# Patient Record
Sex: Male | Born: 1950 | Race: White | Hispanic: No | Marital: Married | Smoking: Former smoker
Health system: Southern US, Community
[De-identification: ages and names within clinical notes are randomized; demographics above are authoritative.]

## PROBLEM LIST (undated history)

## (undated) DIAGNOSIS — K219 Gastro-esophageal reflux disease without esophagitis: Secondary | ICD-10-CM

## (undated) DIAGNOSIS — Z86010 Personal history of colonic polyps: Principal | ICD-10-CM

## (undated) DIAGNOSIS — T7840XA Allergy, unspecified, initial encounter: Secondary | ICD-10-CM

## (undated) DIAGNOSIS — F419 Anxiety disorder, unspecified: Secondary | ICD-10-CM

## (undated) DIAGNOSIS — H269 Unspecified cataract: Secondary | ICD-10-CM

## (undated) DIAGNOSIS — K635 Polyp of colon: Secondary | ICD-10-CM

## (undated) DIAGNOSIS — E119 Type 2 diabetes mellitus without complications: Secondary | ICD-10-CM

## (undated) DIAGNOSIS — I10 Essential (primary) hypertension: Secondary | ICD-10-CM

## (undated) DIAGNOSIS — M1990 Unspecified osteoarthritis, unspecified site: Secondary | ICD-10-CM

## (undated) HISTORY — PX: COLONOSCOPY: SHX174

## (undated) HISTORY — DX: Gastro-esophageal reflux disease without esophagitis: K21.9

## (undated) HISTORY — DX: Allergy, unspecified, initial encounter: T78.40XA

## (undated) HISTORY — PX: SHOULDER ARTHROSCOPY W/ ROTATOR CUFF REPAIR: SHX2400

## (undated) HISTORY — DX: Polyp of colon: K63.5

## (undated) HISTORY — DX: Essential (primary) hypertension: I10

## (undated) HISTORY — PX: OTHER SURGICAL HISTORY: SHX169

## (undated) HISTORY — DX: Anxiety disorder, unspecified: F41.9

## (undated) HISTORY — DX: Personal history of colonic polyps: Z86.010

## (undated) HISTORY — PX: CATARACT EXTRACTION: SUR2

## (undated) HISTORY — DX: Unspecified cataract: H26.9

## (undated) HISTORY — DX: Unspecified osteoarthritis, unspecified site: M19.90

## (undated) HISTORY — DX: Type 2 diabetes mellitus without complications: E11.9

---

## 2001-10-16 ENCOUNTER — Encounter: Payer: Self-pay | Admitting: Family Medicine

## 2001-10-16 ENCOUNTER — Encounter: Admission: RE | Admit: 2001-10-16 | Discharge: 2001-10-16 | Payer: Self-pay | Admitting: Family Medicine

## 2002-02-19 ENCOUNTER — Ambulatory Visit (HOSPITAL_BASED_OUTPATIENT_CLINIC_OR_DEPARTMENT_OTHER): Admission: RE | Admit: 2002-02-19 | Discharge: 2002-02-19 | Payer: Self-pay | Admitting: Orthopedic Surgery

## 2004-04-08 ENCOUNTER — Ambulatory Visit: Payer: Self-pay | Admitting: Gastroenterology

## 2004-04-30 ENCOUNTER — Ambulatory Visit: Payer: Self-pay | Admitting: Gastroenterology

## 2004-06-26 ENCOUNTER — Encounter: Admission: RE | Admit: 2004-06-26 | Discharge: 2004-06-26 | Payer: Self-pay | Admitting: Family Medicine

## 2004-12-03 ENCOUNTER — Ambulatory Visit (HOSPITAL_BASED_OUTPATIENT_CLINIC_OR_DEPARTMENT_OTHER): Admission: RE | Admit: 2004-12-03 | Discharge: 2004-12-03 | Payer: Self-pay | Admitting: Orthopedic Surgery

## 2004-12-03 ENCOUNTER — Ambulatory Visit (HOSPITAL_COMMUNITY): Admission: RE | Admit: 2004-12-03 | Discharge: 2004-12-03 | Payer: Self-pay | Admitting: Orthopedic Surgery

## 2009-05-07 ENCOUNTER — Encounter (INDEPENDENT_AMBULATORY_CARE_PROVIDER_SITE_OTHER): Payer: Self-pay | Admitting: *Deleted

## 2009-05-12 ENCOUNTER — Encounter (INDEPENDENT_AMBULATORY_CARE_PROVIDER_SITE_OTHER): Payer: Self-pay | Admitting: *Deleted

## 2009-06-04 ENCOUNTER — Encounter (INDEPENDENT_AMBULATORY_CARE_PROVIDER_SITE_OTHER): Payer: Self-pay | Admitting: *Deleted

## 2009-06-18 ENCOUNTER — Encounter (INDEPENDENT_AMBULATORY_CARE_PROVIDER_SITE_OTHER): Payer: Self-pay | Admitting: *Deleted

## 2009-06-20 ENCOUNTER — Ambulatory Visit: Payer: Self-pay | Admitting: Gastroenterology

## 2009-07-18 ENCOUNTER — Ambulatory Visit: Payer: Self-pay | Admitting: Gastroenterology

## 2009-07-23 ENCOUNTER — Encounter: Payer: Self-pay | Admitting: Gastroenterology

## 2010-01-19 ENCOUNTER — Observation Stay (HOSPITAL_COMMUNITY): Admission: EM | Admit: 2010-01-19 | Discharge: 2010-01-20 | Payer: Self-pay | Admitting: Emergency Medicine

## 2010-03-13 ENCOUNTER — Emergency Department (HOSPITAL_COMMUNITY)
Admission: EM | Admit: 2010-03-13 | Discharge: 2010-03-13 | Payer: Self-pay | Source: Home / Self Care | Admitting: Family Medicine

## 2010-03-24 NOTE — Letter (Signed)
Summary: Patient Notice-Hyperplastic Polyps  [facility]  [address] [place], [place] [address]   Phone: [phone]  Fax: [phone]        July 23, 2009 MRN: [id number]    Paul Hicks [address] [place], [place]  [address]    Dear Paul Hicks,  I am pleased to inform you that the colon polyp(s) removed during your recent colonoscopy was (were) found to be hyperplastic.  These types of polyps are NOT pre-cancerous.  In view of your family history of colon cancer,  my recommendation is that you have a repeat colonoscopy examination in 5_ years for higher risk colorectal cancer screening.  Should you develop new or worsening symptoms of abdominal pain, bowel habit changes or bleeding from the rectum or bowels, please schedule an evaluation with either your primary care physician or with me.  Additional information/recommendations:  __No further action with gastroenterology is needed at this time.      Please follow-up with your primary care physician for your other      healthcare needs. __Please call [phone] to schedule a return visit to review      your situation.  __Please keep your follow-up visit as already scheduled.  _x_Continue treatment plan as outlined the day of your exam.  Please call us if you are having persistent problems or have questions about your condition that have not been fully answered at this time.  Sincerely,  Paul Meckel MD This letter has been electronically signed by your physician.  Appended Document: Patient Notice-Hyperplastic Polyps letter mailed.

## 2010-03-24 NOTE — Miscellaneous (Signed)
Summary: [facility] PV  Clinical Lists Changes  Medications: Added new medication of MOVIPREP 100 GM  SOLR (PEG-KCL-NACL-NASULF-NA ASC-C) As per prep instructions. - Signed Rx of MOVIPREP 100 GM  SOLR (PEG-KCL-NACL-NASULF-NA ASC-C) As per prep instructions.;  #1 x 0;  Signed;  Entered by: Ezra Sites RN;  Authorized by: Louis Meckel MD;  Method used: [facility]  [place] [address] [address], [place], [place]  [address], Ph: [phone] or [phone], Fax: [phone] Observations: Added new observation of NKA: T (06/20/2009 13:44)    Prescriptions: MOVIPREP 100 GM  SOLR (PEG-KCL-NACL-NASULF-NA ASC-C) As per prep instructions.  #1 x 0   Entered by:   Ezra Sites RN   Authorized by:   Louis Meckel MD   Signed by:   Ezra Sites RN on 06/20/2009   Method used:   [facility]  [place] [address] (retail[facility]       [address]       [place], [place]  [address]       Ph: [phone] or [phone]       Fax: [phone]   RxID:   [id number]

## 2010-03-24 NOTE — Letter (Signed)
Summary: [facility] Instructions  [facility]  [address] [place], [place] [address]   Phone: [phone]  Fax: [phone]       Paul Hicks    10-18-1950    MRN: [id number]        Procedure Day /Date:  Friday 07/18/2009     Arrival Time: 8:30 am      Procedure Time: 9:30 am     Location of Procedure:                    _ x_  [facility] (4th Floor)                        PREPARATION FOR COLONOSCOPY WITH MOVIPREP   Starting 5 days prior to your procedure Sunday 5/22 do not eat nuts, seeds, popcorn, corn, beans, peas,  salads, or any raw vegetables.  Do not take any fiber supplements (e.g. Metamucil, Citrucel, and Benefiber).  THE DAY BEFORE YOUR PROCEDURE         DATE: Thursday 5/26  1.  Drink clear liquids the entire day-NO SOLID FOOD  2.  Do not drink anything colored red or purple.  Avoid juices with pulp.  No orange juice.  3.  Drink at least 64 oz. (8 glasses) of fluid/clear liquids during the day to prevent dehydration and help the prep work efficiently.  CLEAR LIQUIDS INCLUDE: Water Jello Ice Popsicles Tea (sugar ok, no milk/cream) Powdered fruit flavored drinks Coffee (sugar ok, no milk/cream) Gatorade Juice: apple, white grape, white cranberry  Lemonade Clear bullion, consomm, broth Carbonated beverages (any kind) Strained chicken noodle soup Hard Candy                             4.  In the morning, mix first dose of MoviPrep solution:    Empty 1 Pouch A and 1 Pouch B into the disposable container    Add lukewarm drinking water to the top line of the container. Mix to dissolve    Refrigerate (mixed solution should be used within 24 hrs)  5.  Begin drinking the prep at 5:00 p.m. The MoviPrep container is divided by 4 marks.   Every 15 minutes drink the solution down to the next mark (approximately 8 oz) until the full liter is complete.   6.  Follow completed prep with 16 oz of clear liquid of your choice (Nothing red  or purple).  Continue to drink clear liquids until bedtime.  7.  Before going to bed, mix second dose of MoviPrep solution:    Empty 1 Pouch A and 1 Pouch B into the disposable container    Add lukewarm drinking water to the top line of the container. Mix to dissolve    Refrigerate  THE DAY OF YOUR PROCEDURE      DATE: Friday 5/27  Beginning at 4:30 a.m. (5 hours before procedure):         1. Every 15 minutes, drink the solution down to the next mark (approx 8 oz) until the full liter is complete.  2. Follow completed prep with 16 oz. of clear liquid of your choice.    3. You may drink clear liquids until 7:30 am (2 HOURS BEFORE PROCEDURE).   MEDICATION INSTRUCTIONS  Unless otherwise instructed, you should take regular prescription medications with a small sip of water   as early as possible the morning of  your procedure.   Additional medication instructions: Do not take Diovan/HCTZ morning of procedure.         OTHER INSTRUCTIONS  You will need a responsible adult at least 62 years of age to accompany you and drive you home.   This person must remain in the waiting room during your procedure.  Wear loose fitting clothing that is easily removed.  Leave jewelry and other valuables at home.  However, you may wish to bring a book to read or  an iPod/MP3 player to listen to music as you wait for your procedure to start.  Remove all body piercing jewelry and leave at home.  Total time from sign-in until discharge is approximately 2-3 hours.  You should go home directly after your procedure and rest.  You can resume normal activities the  day after your procedure.  The day of your procedure you should not:   Drive   Make legal decisions   Operate machinery   Drink alcohol   Return to work  You will receive specific instructions about eating, activities and medications before you leave.    The above instructions have been reviewed and explained to me by    Ezra Sites RN  June 20, 2009 2:09 PM    I fully understand and can verbalize these instructions _____________________________ Date _________

## 2010-03-24 NOTE — Procedures (Signed)
Summary: Colonoscopy  Patient: [name] Note: All result statuses are Final unless otherwise noted.  Tests: (1) Colonoscopy (COL)   COL Colonoscopy           DONE     [facility]     [address]     [place], [place]  [address]           COLONOSCOPY PROCEDURE REPORT           PATIENT:  Paul Hicks, Paul Hicks  MR#:  [id number]     BIRTHDATE:  1951/02/18, 58 yrs. old  GENDER:  male           ENDOSCOPIST:  Barbette Hair. Arlyce Dice, MD     Referred by:           PROCEDURE DATE:  07/18/2009     PROCEDURE:  Colonoscopy with snare polypectomy     ASA CLASS:  Class II     INDICATIONS:  1) screening  2) family history of colon cancer  3)     history of pre-cancerous (adenomatous) colon polyps brother           MEDICATIONS:   Fentanyl 75 mcg IV, Versed 6 mg IV           DESCRIPTION OF PROCEDURE:   After the risks benefits and     alternatives of the procedure were thoroughly explained, informed     consent was obtained.  No rectal exam performed. The LB CF-H180AL     [id number] endoscope was introduced through the anus and advanced to     the cecum, which was identified by both the appendix and ileocecal     valve, without limitations.  The quality of the prep was     excellent, using MoviPrep.  The instrument was then slowly     withdrawn as the colon was fully examined.     <<PROCEDUREIMAGES>>           FINDINGS:  There were multiple polyps identified and removed. in     the sigmoid colon (see image15). 3 diminutive hyperplastic     appearing polyps at 13-15cm - removed with cold biopsy snare and     submitted to pathology  Moderate diverticulosis was found in the     sigmoid colon (see image12 and image13).  Mild diverticulosis was     found in the descending colon.  This was otherwise a normal     examination of the colon (see image1, image3, image4, image6,     image9, image14, and image18).   Retroflexed views in the rectum     revealed no abnormalities.    The time to cecum =  2.0   minutes.     The scope was then withdrawn (time =  8.75  min) from the patient     and the procedure completed.           COMPLICATIONS:  None           ENDOSCOPIC IMPRESSION:     1) Polyps, multiple in the sigmoid colon     2) Moderate diverticulosis in the sigmoid colon     3) Mild diverticulosis in the descending colon     4) Otherwise normal examination     RECOMMENDATIONS:     1) Given your significant family history of colon cancer, you     should have a repeat colonoscopy in 5 years           REPEAT EXAM:  In 5 year(s) for Colonoscopy.           ______________________________     Barbette Hair. Arlyce Dice, MD           CC: Herb Grays, MD           n.     Rosalie DoctorBarbette Hair. Atianna Haidar at 07/18/2009 10:39 AM           Page 2 of 3   [place], [place], [id number]  Note: An exclamation mark (!) indicates a result that was not dispersed into the flowsheet. Document Creation Date: 07/18/2009 10:40 AM _______________________________________________________________________  (1) Order result status: Final Collection or observation date-time: 07/18/2009 10:33 Requested date-time:  Receipt date-time:  Reported date-time:  Referring Physician:   Ordering Physician: Melvia Heaps [phone]) Specimen Source:  Source: Launa Grill Order Number: [id number] Lab site:   Appended Document: Colonoscopy     Procedures Next Due Date:    Colonoscopy: 07/2014

## 2010-03-24 NOTE — Letter (Signed)
Summary: Previsit letter  [facility]  [address] [place], [place] [address]   Phone: [phone]  Fax: [phone]       05/12/2009 MRN: [id number]  Paul Hicks [address] [place], [place]  [address]  Dear [name],  Welcome to the Gastroenterology Division at [facility]    You are scheduled to see a nurse for your pre-procedure visit on 06-20-09 at 2:00p.m. on the 3rd floor at [facility], [address]  We ask that you try to arrive at our office 15 minutes prior to your appointment time to allow for check-in.  Your nurse visit will consist of discussing your medical and surgical history, your immediate family medical history, and your medications.    Please bring a complete list of all your medications or, if you prefer, bring the medication bottles and we will list them.  We will need to be aware of both prescribed and over the counter drugs.  We will need to know exact dosage information as well.  If you are on blood thinners (Coumadin, Plavix, Aggrenox, Ticlid, etc.) please call our office today/prior to your appointment, as we need to consult with your physician about holding your medication.   Please be prepared to read and sign documents such as consent forms, a financial agreement, and acknowledgement forms.  If necessary, and with your consent, a friend or relative is welcome to sit-in on the nurse visit with you.  Please bring your insurance card so that we may make a copy of it.  If your insurance requires a referral to see a specialist, please bring your referral form from your primary care physician.  No co-pay is required for this nurse visit.     If you cannot keep your appointment, please call [phone] to cancel or reschedule prior to your appointment date.  This allows [place] the opportunity to schedule an appointment for another patient in need of care.    Thank you for choosing [facility] for your medical  needs.  We appreciate the opportunity to care for you.  Please visit [place] at our website  to learn more about our practice.                     Sincerely.                                                                                                                   The Gastroenterology Division

## 2010-03-24 NOTE — Letter (Signed)
Summary: Appointment Reminder  [facility]  [address] [place], [place] [address]   Phone: [phone]  Fax: [phone]        June 04, 2009 MRN: [id number]    NELS MUNN [address] [place], [place]  [address]    Dear Mr. KABLER,   We have been unable to reach you by phone to reschedule an   appointment that was scheduled for you by Dr. Arlyce Dice for a Colonoscopy. It is very important that we reach you to reschedule an appointment. We apologize for the inconvenience and hope that you allow [place] to participate in your health care needs. Please contact us at [phone] at your earliest convenience to reschedule your appointment.     Sincerely,  [employer]

## 2010-03-24 NOTE — Letter (Signed)
Summary: Colonoscopy Letter  [facility]  [address] [place], [place] [address]   Phone: [phone]  Fax: [phone]      May 07, 2009 MRN: [id number]   Paul Hicks [address] [place], [place]  [address]   Dear [name],   According to your medical record, it is time for you to schedule a Colonoscopy. The American Cancer Society recommends this procedure as a method to detect early colon cancer. Patients with a family history of colon cancer, or a personal history of colon polyps or inflammatory bowel disease are at increased risk.  This letter has beeen generated based on the recommendations made at the time of your procedure. If you feel that in your particular situation this may no longer apply, please contact our office.  Please call our office at [phone] to schedule this appointment or to update your records at your earliest convenience.  Thank you for cooperating with [place] to provide you with the very best care possible.   Sincerely,  Barbette Hair. Arlyce Dice, M.D.  [facility] Gastroenterology Division [phone]

## 2010-05-05 LAB — BASIC METABOLIC PANEL
CO2: 20 mEq/L (ref 19–32)
Chloride: 109 mEq/L (ref 96–112)
Creatinine, Ser: 1.2 mg/dL (ref 0.4–1.5)
GFR calc Af Amer: >60 mL/min (ref >60)
Sodium: 135 mEq/L (ref 135–145)

## 2010-05-05 LAB — URINALYSIS, ROUTINE W REFLEX MICROSCOPIC
Ketones, ur: NEGATIVE mg/dL
Leukocytes, UA: NEGATIVE
Nitrite: NEGATIVE
Protein, ur: 100 mg/dL — AB
Urobilinogen, UA: 0.2 mg/dL (ref 0.0–1.0)
pH: 5.5 (ref 5.0–8.0)

## 2010-05-05 LAB — URINE MICROSCOPIC-ADD ON

## 2010-05-05 LAB — CBC
HCT: 51.6 % (ref 39.0–52.0)
Hemoglobin: 17.5 g/dL — ABNORMAL HIGH (ref 13.0–17.0)
MCH: 28.6 pg (ref 26.0–34.0)
MCHC: 33.9 g/dL (ref 30.0–36.0)
MCV: 84.3 fL (ref 78.0–100.0)
Platelets: 216 10*3/uL (ref 150–400)
RBC: 6.12 MIL/uL — ABNORMAL HIGH (ref 4.22–5.81)
RDW: 14.4 % (ref 11.5–15.5)
WBC: 9.9 10*3/uL (ref 4.0–10.5)

## 2010-05-05 LAB — COMPREHENSIVE METABOLIC PANEL
ALT: 29 U/L (ref 0–53)
AST: 26 U/L (ref 0–37)
Albumin: 4 g/dL (ref 3.5–5.2)
Alkaline Phosphatase: 66 U/L (ref 39–117)
BUN: 37 mg/dL — ABNORMAL HIGH (ref 6–23)
CO2: 18 mEq/L — ABNORMAL LOW (ref 19–32)
Calcium: 9.2 mg/dL (ref 8.4–10.5)
Chloride: 105 mEq/L (ref 96–112)
Creatinine, Ser: 1.52 mg/dL — ABNORMAL HIGH (ref 0.4–1.5)
GFR calc Af Amer: 57 mL/min — ABNORMAL LOW (ref >60)
GFR calc non Af Amer: 47 mL/min — ABNORMAL LOW (ref >60)
Glucose, Bld: 139 mg/dL — ABNORMAL HIGH (ref 70–99)
Potassium: 4.1 mEq/L (ref 3.5–5.1)
Sodium: 134 mEq/L — ABNORMAL LOW (ref 135–145)
Total Bilirubin: 0.6 mg/dL (ref 0.3–1.2)
Total Protein: 7.9 g/dL (ref 6.0–8.3)

## 2010-05-05 LAB — DIFFERENTIAL
Basophils Absolute: 0 10*3/uL (ref 0.0–0.1)
Basophils Relative: 0 % (ref 0–1)
Eosinophils Absolute: 0 10*3/uL (ref 0.0–0.7)
Eosinophils Relative: 0 % (ref 0–5)
Lymphs Abs: 0.8 10*3/uL (ref 0.7–4.0)
Neutrophils Relative %: 85 % — ABNORMAL HIGH (ref 43–77)

## 2010-05-05 LAB — LIPASE, BLOOD: Lipase: 26 U/L (ref 11–59)

## 2010-07-10 NOTE — Op Note (Signed)
NAME:  Paul Hicks, Paul Hicks                           ACCOUNT NO.:  [id number]   MEDICAL RECORD NO.:  [id number]                   PATIENT TYPE:  AMB   LOCATION:  DSC                                  FACILITY:  [facility]   PHYSICIAN:  Nadara Mustard, M.D.                DATE OF BIRTH:  1950/07/26   DATE OF PROCEDURE:  02/19/2002  DATE OF DISCHARGE:                                 OPERATIVE REPORT   PREOPERATIVE DIAGNOSIS:  Left shoulder rotator cuff tear, with superior  labrum, anterior posterior lesion and impingement syndrome.   POSTOPERATIVE DIAGNOSIS:  Left shoulder rotator cuff tear, with superior  labrum, anterior posterior lesion and impingement syndrome.   PROCEDURE:  1. Left shoulder arthroscopy with debridement of the superior labrum     anterior posterior lesion and rotator cuff tear.  2. Subacromial decompression.   SURGEON:  Nadara Mustard, M.D.   ANESTHESIA:  General.   ESTIMATED BLOOD LOSS:  Minimal.   ANTIBIOTICS:  None.   DRAINS:  None.   COMPLICATIONS:  None.   DISPOSITION:  To PACU in stable condition.   INDICATIONS FOR PROCEDURE:  The patient is a 60 year old gentleman with  chronic left shoulder symptoms.  He has failed conservative care and  presents at this time for an arthroscopic intervention.  On physical  examination he had a positive ________, positive Hawkins impingement test,  negative drop-arm test.  A neck examination was negative.  After failure of  conservative care, the patient presented at this time for an arthroscopic  intervention.  The risks and benefits were discussed, including infection,  neurovascular injury, persistent pain, need for additional surgery.  The  patient states he understands and wishes to proceed at this time.   DESCRIPTION OF PROCEDURE:  The patient was brought to operating room #5 and  underwent a general anesthetic.  After an adequate level of anesthesia was  obtained, the patient was placed in the beach chair  position, and his left  upper extremity was prepped using Duraprep and draped into a sterile field.  The scope was inserted through the posterior portal, and an anterior portal  was established from the outside-in technique.  Visualization showed a large  SLAP tear, as well as partial tearing of the biceps tendon and a large  partial thickness tearing of the rotator cuff, just superior to the glenoid.  The attachment of the rotator cuff was stable and intact.  The articular  cartilage was also intact of the humeral head and glenoid.  From the  anterior portal, the SLAP tear and the partial rotator cuff tear were  debrided.  The vapor Mitek was used for hemostasis.  Instruments were  removed.  The scope was then inserted into the subacromial space from the  posterior portal.  A lateral portal was established as a working portal.  A  subacromial decompression was performed.  An acromionizer  and shaver were  both used to debride the very hook-type free acromion.  Vapor Mitek was used  for hemostasis.  There was no full-thickness rotator cuff tears visible from  the superior aspect of the rotator cuff.  The instruments were removed from  the portals and the joint was injected with a total of 20 cc of 0.5%  Marcaine plain and 4 mg of morphine.  The portals were closed using #3-0  nylon.  The wounds were covered with Adaptic, orthopedic sponges, ABD  dressing and Hypafix tape.  The patient was extubated and placed in a sling  and taken to the PACU in stable condition.                                                Nadara Mustard, M.D.    MVD/MEDQ  D:  02/19/2002  T:  02/19/2002  Job:  [id number]

## 2010-07-10 NOTE — Op Note (Signed)
Paul Hicks, Paul Hicks                 ACCOUNT NO.:  [id number]   MEDICAL RECORD NO.:  [id number]          PATIENT TYPE:  AMB   LOCATION:  DSC                          FACILITY:  [facility]   PHYSICIAN:  Nadara Mustard, MD     DATE OF BIRTH:  02/06/1951   DATE OF PROCEDURE:  12/03/2004  DATE OF DISCHARGE:                                 OPERATIVE REPORT   PREOPERATIVE DIAGNOSIS:  Impingement of right ankle.   POSTOPERATIVE DIAGNOSES:  1.  Impingement of right ankle with synovitis.  2.  Osteochondral defect, lateral talar dome.   PROCEDURE:  1.  Partial synovectomy.  2.  Debridement of osteochondral defect, lateral talar dome.   SURGEON:  Nadara Mustard, MD   ANESTHESIA:  General plus popliteal block.   ESTIMATED BLOOD LOSS:  Minimal.   ANTIBIOTICS:  One gram of Kefzol.   TOURNIQUET TIME:  None.   DRAINS:  None.   COMPLICATIONS:  None.   DISPOSITION:  To PACU in stable condition.   INDICATION FOR PROCEDURE:  The patient is a 59 year old gentleman with  chronic impingement symptoms of his right ankle.  The patient has failed  conservative care, has pain with activities of daily living and presents at  this time for arthroscopic intervention.  The risks and benefits were  discussed including infection, neurovascular injury, persistent pain, and  need for additional surgery.  The patient states he understands and wished  proceed at this time.   DESCRIPTION OF PROCEDURE:  The patient was brought to OR room 2 after  undergoing a popliteal block.  The patient was then placed supine on the  operating table and underwent general LMA anesthetic.  After an adequate  level of anesthesia was obtained, the patient's right lower extremity was  prepped using DuraPrep, draped into a sterile field and was placed in 10  pounds of traction.  Scope was inserted through the inferomedial portal  after the joint was infused with total 10 mL of normal saline.  The skin was  incised, blunt  dissection was carried down to the capsule and the scope was  inserted through the inferomedial portal.  With the lights off, the  anterolateral portal was identified; there were no neurovascular structures.  The 18-gauge needle was inserted into the joint.  The skin was incised.  Blunt dissection was carried down to the joint and the shaver was inserted.  Visualization showed a significant amount of synovitis anteriorly.  This was  debrided with a shaver and the Vapor Mitek was used for hemostasis.  The  patient had a large osteochondral defect of the lateral talar dome and using  the ring curette, this was debrided back to bleeding viable subchondral  bone.  The osteochondral defect and articular cartilage edges were stable  and intact.  The shaver was used to further debride the joint to remove it  of all loose cartilage debris.  The Vapor Mitek was used for hemostasis.  After thorough debridement and irrigation, the instruments were removed.  The portals were closed using 3-0 nylon.  The wound was covered  with  Adaptic, orthopedic sponges, sterile Webril and a Coban dressing.  The  patient was extubated and taken to PACU in stable condition.   Plan for weightbearing as tolerated, change dressing in 2 days, follow up in  office in 2 weeks.      Nadara Mustard, MD  Electronically Signed     MVD/MEDQ  D:  12/03/2004  T:  12/04/2004  Job:  [id number]

## 2013-11-27 ENCOUNTER — Encounter: Payer: Self-pay | Admitting: Gastroenterology

## 2014-05-20 ENCOUNTER — Encounter: Payer: Self-pay | Admitting: Gastroenterology

## 2014-05-28 ENCOUNTER — Encounter: Payer: Self-pay | Admitting: Gastroenterology

## 2014-07-18 ENCOUNTER — Encounter: Payer: Self-pay | Admitting: Gastroenterology

## 2014-07-18 ENCOUNTER — Ambulatory Visit (AMBULATORY_SURGERY_CENTER): Payer: Self-pay | Admitting: *Deleted

## 2014-07-18 VITALS — Ht 72.0 in | Wt 310.0 lb

## 2014-07-18 DIAGNOSIS — Z86010 Personal history of colonic polyps: Secondary | ICD-10-CM

## 2014-07-18 MED ORDER — NA SULFATE-K SULFATE-MG SULF 17.5-3.13-1.6 GM/177ML PO SOLN: ORAL | Status: DC

## 2014-07-18 NOTE — Progress Notes (Signed)
No egg or soy allergy  No anesthesia or intubation problems per pt  No diet medications taken  Registered in EMMI   

## 2014-07-30 ENCOUNTER — Ambulatory Visit (AMBULATORY_SURGERY_CENTER): Payer: Medicare HMO | Admitting: Gastroenterology

## 2014-07-30 ENCOUNTER — Encounter: Payer: Self-pay | Admitting: Gastroenterology

## 2014-07-30 VITALS — BP 144/77 | HR 70 | Temp 97.6°F | Resp 16 | Ht 72.0 in | Wt 310.0 lb

## 2014-07-30 DIAGNOSIS — Z860100 Personal history of colon polyps, unspecified: Secondary | ICD-10-CM

## 2014-07-30 DIAGNOSIS — K635 Polyp of colon: Secondary | ICD-10-CM | POA: Diagnosis not present

## 2014-07-30 DIAGNOSIS — D128 Benign neoplasm of rectum: Secondary | ICD-10-CM

## 2014-07-30 DIAGNOSIS — K5730 Diverticulosis of large intestine without perforation or abscess without bleeding: Secondary | ICD-10-CM

## 2014-07-30 DIAGNOSIS — D120 Benign neoplasm of cecum: Secondary | ICD-10-CM

## 2014-07-30 DIAGNOSIS — K621 Rectal polyp: Secondary | ICD-10-CM

## 2014-07-30 DIAGNOSIS — Z860101 Personal history of adenomatous and serrated colon polyps: Secondary | ICD-10-CM

## 2014-07-30 DIAGNOSIS — D124 Benign neoplasm of descending colon: Secondary | ICD-10-CM

## 2014-07-30 HISTORY — DX: Personal history of adenomatous and serrated colon polyps: Z86.0101

## 2014-07-30 HISTORY — DX: Personal history of colonic polyps: Z86.010

## 2014-07-30 MED ORDER — SODIUM CHLORIDE 0.9 % IV SOLN: 500.0000 mL | INTRAVENOUS | Status: DC

## 2014-07-30 NOTE — Progress Notes (Signed)
Report to PACU, RN, vss, BBS= Clear.  

## 2014-07-30 NOTE — Progress Notes (Signed)
Called to room to assist during endoscopic procedure.  Patient ID and intended procedure confirmed with present staff. Received instructions for my participation in the procedure from the performing physician.  

## 2014-07-30 NOTE — Patient Instructions (Addendum)
Call to schedule an appointment with Dr. Deatra Ina in 3- 5 weeks, [phone].   YOU HAD AN ENDOSCOPIC PROCEDURE TODAY AT [facility]:   Refer to the procedure report that was given to you for any specific questions about what was found during the examination.  If the procedure report does not answer your questions, please call your gastroenterologist to clarify.  If you requested that your care partner not be given the details of your procedure findings, then the procedure report has been included in a sealed envelope for you to review at your convenience later.  YOU SHOULD EXPECT: Some feelings of bloating in the abdomen. Passage of more gas than usual.  Walking can help get rid of the air that was put into your GI tract during the procedure and reduce the bloating. If you had a lower endoscopy (such as a colonoscopy or flexible sigmoidoscopy) you may notice spotting of blood in your stool or on the toilet paper. If you underwent a bowel prep for your procedure, you may not have a normal bowel movement for a few days.  Please Note:  You might notice some irritation and congestion in your nose or some drainage.  This is from the oxygen used during your procedure.  There is no need for concern and it should clear up in a day or so.  SYMPTOMS TO REPORT IMMEDIATELY:   Following lower endoscopy (colonoscopy or flexible sigmoidoscopy):  Excessive amounts of blood in the stool  Significant tenderness or worsening of abdominal pains  Swelling of the abdomen that is new, acute  Fever of 100F or higher   For urgent or emergent issues, a gastroenterologist can be reached at any hour by calling [phone].   DIET: Your first meal following the procedure should be a small meal and then it is ok to progress to your normal diet. Heavy or fried foods are harder to digest and may make you feel nauseous or bloated.  Likewise, meals heavy in dairy and vegetables can increase bloating.  Drink  plenty of fluids but you should avoid alcoholic beverages for 24 hours.  ACTIVITY:  You should plan to take it easy for the rest of today and you should NOT DRIVE or use heavy machinery until tomorrow (because of the sedation medicines used during the test).    FOLLOW UP: Our staff will call the number listed on your records the next business day following your procedure to check on you and address any questions or concerns that you may have regarding the information given to you following your procedure. If we do not reach you, we will leave a message.  However, if you are feeling well and you are not experiencing any problems, there is no need to return our call.  We will assume that you have returned to your regular daily activities without incident.  If any biopsies were taken you will be contacted by phone or by letter within the next 1-3 weeks.  Please call us at [phone] if you have not heard about the biopsies in 3 weeks.    SIGNATURES/CONFIDENTIALITY: You and/or your care partner have signed paperwork which will be entered into your electronic medical record.  These signatures attest to the fact that that the information above on your After Visit Summary has been reviewed and is understood.  Full responsibility of the confidentiality of this discharge information lies with you and/or your care-partner.

## 2014-07-30 NOTE — Op Note (Signed)
[place]  [address] [place], [address]   COLONOSCOPY PROCEDURE REPORT  PATIENT: Wynston, Romey  MR#: [id number] BIRTHDATE: 09-20-1950 , 23  yrs. old GENDER: male ENDOSCOPIST: Inda Castle, MD REFERRED BY: PROCEDURE DATE:  07/30/2014 PROCEDURE:   Colonoscopy, screening, Colonoscopy with cold biopsy polypectomy, and Colonoscopy with snare polypectomy First Screening Colonoscopy - Avg.  risk and is 50 yrs.  old or older - No.  Prior Negative Screening - Now for repeat screening. Above average risk  History of Adenoma - Now for follow-up colonoscopy & has been > or = to 3 yrs.  N/A  Polyps removed today? Yes ASA CLASS:   Class II INDICATIONS:FH Colon or Rectal Adenocarcinoma. MEDICATIONS: Monitored anesthesia care and Propofol 200 mg IV  DESCRIPTION OF PROCEDURE:   After the risks benefits and alternatives of the procedure were thoroughly explained, informed consent was obtained.  The digital rectal exam revealed no abnormalities of the rectum.   The LB Olympus Loaner [id number] endoscope was introduced through the anus and advanced to the cecum, which was identified by both the appendix and ileocecal valve. No adverse events experienced.   The quality of the prep was (Suprep was used) excellent.  The instrument was then slowly withdrawn as the colon was fully examined. Estimated blood loss is zero unless otherwise noted in this procedure report.      COLON FINDINGS: A polypoid shaped flat polyp measuring 35 mm in size was found at the cecum.  Multiple biopsies were performed using cold forceps.   Two sessile polyps measuring 5 mm in size were found.  A polypectomy was performed with a cold snare.  The resection was complete, the polyp tissue was completely retrieved and sent to histology.   There was moderate diverticulosis noted in the descending colon and sigmoid colon.   A sessile polyp measuring 3 mm in size was found in the rectum.  A polypectomy  was performed using snare cautery.  The resection was complete, the polyp tissue was completely retrieved and sent to histology.  Retroflexed views revealed no abnormalities. The time to cecum = 2.1 Withdrawal time = 10.2   The scope was withdrawn and the procedure completed. COMPLICATIONS: There were no immediate complications.  ENDOSCOPIC IMPRESSION: 1.   Flat polyp was found at the cecum; multiple biopsies were performed using cold forceps 2.   Two sessile polyps were found; polypectomy was performed with a cold snare 3.   Moderate diverticulosis was noted in the descending colon and sigmoid colon 4.   Sessile polyp was found in the rectum; polypectomy was performed using snare cautery  RECOMMENDATIONS: 1.  Await biopsy results 2.  Call to schedule a follow-up appointment with office 3 - 5 week(s)  eSigned:  Inda Castle, MD 07/30/2014 9:35 AM   cc: Anastasia Pall, MD   PATIENT NAME:  Cornellius, Kropp MR#: [id number]

## 2014-07-31 ENCOUNTER — Telehealth: Payer: Self-pay | Admitting: *Deleted

## 2014-07-31 NOTE — Telephone Encounter (Signed)
  Follow up Call-  Call back number 07/30/2014  Post procedure Call Back phone  # [phone]  Permission to leave phone message Yes     Patient questions:  Do you have a fever, pain , or abdominal swelling? No. Pain Score  0 *  Have you tolerated food without any problems? Yes.    Have you been able to return to your normal activities? Yes.    Do you have any questions about your discharge instructions: Diet   No. Medications  No. Follow up visit  No.  Do you have questions or concerns about your Care? No.  Actions: * If pain score is 4 or above: No action needed, pain <4.

## 2014-08-01 ENCOUNTER — Encounter: Payer: Self-pay | Admitting: Gastroenterology

## 2014-08-13 ENCOUNTER — Telehealth: Payer: Self-pay | Admitting: *Deleted

## 2014-08-13 NOTE — Telephone Encounter (Signed)
Late entry- spoke with pt on 08-12-14 at 1030.  Per Dr. Deatra Ina, pt informed that his biopsies didn't show cancer.  Dr. Deatra Ina does want to see him in the office to discuss findings.  Appt made for pt for 09-06-14 at 9:30.

## 2014-09-06 ENCOUNTER — Ambulatory Visit (INDEPENDENT_AMBULATORY_CARE_PROVIDER_SITE_OTHER): Payer: Medicare HMO | Admitting: Gastroenterology

## 2014-09-06 ENCOUNTER — Encounter: Payer: Self-pay | Admitting: Gastroenterology

## 2014-09-06 VITALS — BP 160/86 | HR 58 | Ht 72.0 in | Wt 304.1 lb

## 2014-09-06 DIAGNOSIS — D120 Benign neoplasm of cecum: Secondary | ICD-10-CM | POA: Insufficient documentation

## 2014-09-06 MED ORDER — NA SULFATE-K SULFATE-MG SULF 17.5-3.13-1.6 GM/177ML PO SOLN: 1.0000 | Freq: Once | ORAL | Status: DC

## 2014-09-06 NOTE — Assessment & Plan Note (Signed)
Large sessile adenoma of the cecum.   20 minutes were spent with the patient and/or family.  Greater than 50% was spent in counseling and coordination of care with the patient.  We discussed means for removing the polyp including endoscopic mucosal resection and surgical resection.  The iris of bleeding and perforation were discussed in detail associate with EMR.  The patient has decided to proceed with colonoscopy for polyp removal.

## 2014-09-06 NOTE — Patient Instructions (Signed)
You have been scheduled for a colonoscopy. Please follow written instructions given to you at your visit today.  Please pick up your prep supplies at the pharmacy within the next 1-3 days. If you use inhalers (even only as needed), please bring them with you on the day of your procedure.   

## 2014-09-06 NOTE — Progress Notes (Signed)
    _                                                                                                                History of Present Illness:  Paul Hicks is a pleasant 64 year old white male who recently underwent colonoscopy where a 3 cm sessile, flat cecal polyp was seen.  Biopsies demonstrated adenomatous changes only.  He has no GI complaints.   Past Medical History  Diagnosis Date  . Anxiety   . GERD (gastroesophageal reflux disease)   . Hypertension   . Polyp of colon    Past Surgical History  Procedure Laterality Date  . Cataract extraction    . Shoulder arthroscopy w/ rotator cuff repair      both shoulders  . Right foot    . Colonoscopy     family history includes Colon cancer in his brother. There is no history of Esophageal cancer, Rectal cancer, or Stomach cancer. Current Outpatient Prescriptions  Medication Sig Dispense Refill  . cloNIDine (CATAPRES) 0.1 MG tablet Take 0.1 mg by mouth. 2 daily    . diltiazem (TIAZAC) 300 MG 24 hr capsule Take 300 mg by mouth. daily    . FLUoxetine (PROZAC) 20 MG tablet Take 20 mg by mouth. daily    . Multiple Vitamin (THERA) TABS Take 1 tablet by mouth. daily    . Omega-3 Fatty Acids (FISH OIL) 1000 MG CAPS Take 1 capsule by mouth. 2 daily    . ranitidine (ZANTAC) 300 MG tablet daily.    . valsartan-hydrochlorothiazide (DIOVAN-HCT) 160-12.5 MG per tablet Take 1 tablet by mouth. daily     No current facility-administered medications for this visit.   Allergies as of 09/06/2014  . (No Known Allergies)    reports that he has quit smoking. He has never used smokeless tobacco. He reports that he drinks about 0.6 oz of alcohol per week. He reports that he does not use illicit drugs.   Review of Systems: Pertinent positive and negative review of systems were noted in the above HPI section. All other review of systems were otherwise negative.  Vital signs were reviewed in today's medical record Physical Exam: General:  Well developed , well nourished, no acute distress Skin: anicteric Head: Normocephalic and atraumatic Eyes:  sclerae anicteric, EOMI Ears: Normal auditory acuity Mouth: No deformity or lesions Neck: Supple, no masses or thyromegaly Lymph Nodes: no lymphadenopathy Lungs: Clear throughout to auscultation Heart: Regular rate and rhythm; no murmurs, rubs or bruits Gastroinestinal: Soft, non tender and non distended. No masses, hepatosplenomegaly or hernias noted. Normal Bowel sounds Rectal:deferred Musculoskeletal: Symmetrical with no gross deformities  Skin: No lesions on visible extremities Pulses:  Normal pulses noted Extremities: No clubbing, cyanosis, edema or deformities noted Neurological: Alert oriented x 4, grossly nonfocal Cervical Nodes:  No significant cervical adenopathy Inguinal Nodes: No significant inguinal adenopathy Psychological:  Alert and cooperative. Normal mood and affect  See Assessment and Plan under Problem List

## 2014-09-26 ENCOUNTER — Telehealth: Payer: Self-pay

## 2014-09-26 NOTE — Telephone Encounter (Signed)
Left message for pt to pick up suprep at front desk.

## 2014-10-02 ENCOUNTER — Encounter (HOSPITAL_COMMUNITY): Payer: Self-pay | Admitting: *Deleted

## 2014-10-07 ENCOUNTER — Ambulatory Visit (HOSPITAL_COMMUNITY): Payer: Medicare HMO | Admitting: Certified Registered"

## 2014-10-07 ENCOUNTER — Encounter (HOSPITAL_COMMUNITY): Payer: Self-pay

## 2014-10-07 ENCOUNTER — Ambulatory Visit (HOSPITAL_COMMUNITY)
Admission: RE | Admit: 2014-10-07 | Discharge: 2014-10-07 | Disposition: A | Discharge status: Home / Self Care | Payer: Medicare HMO | Source: Ambulatory Visit | Attending: Gastroenterology | Admitting: Gastroenterology

## 2014-10-07 ENCOUNTER — Encounter (HOSPITAL_COMMUNITY)
Admission: RE | Disposition: A | Discharge status: Home / Self Care | Payer: Self-pay | Source: Ambulatory Visit | Attending: Gastroenterology

## 2014-10-07 DIAGNOSIS — Z87891 Personal history of nicotine dependence: Secondary | ICD-10-CM | POA: Insufficient documentation

## 2014-10-07 DIAGNOSIS — Z1211 Encounter for screening for malignant neoplasm of colon: Secondary | ICD-10-CM | POA: Diagnosis not present

## 2014-10-07 DIAGNOSIS — D120 Benign neoplasm of cecum: Secondary | ICD-10-CM | POA: Diagnosis not present

## 2014-10-07 DIAGNOSIS — I10 Essential (primary) hypertension: Secondary | ICD-10-CM | POA: Insufficient documentation

## 2014-10-07 DIAGNOSIS — Z79899 Other long term (current) drug therapy: Secondary | ICD-10-CM | POA: Insufficient documentation

## 2014-10-07 DIAGNOSIS — K5730 Diverticulosis of large intestine without perforation or abscess without bleeding: Secondary | ICD-10-CM | POA: Diagnosis not present

## 2014-10-07 DIAGNOSIS — K219 Gastro-esophageal reflux disease without esophagitis: Secondary | ICD-10-CM | POA: Insufficient documentation

## 2014-10-07 DIAGNOSIS — F419 Anxiety disorder, unspecified: Secondary | ICD-10-CM | POA: Diagnosis not present

## 2014-10-07 DIAGNOSIS — Z800 Family history of malignant neoplasm of digestive organs: Secondary | ICD-10-CM | POA: Diagnosis not present

## 2014-10-07 HISTORY — PX: COLONOSCOPY WITH PROPOFOL: SHX5780

## 2014-10-07 HISTORY — PX: HOT HEMOSTASIS: SHX5433

## 2014-10-07 SURGERY — COLONOSCOPY WITH PROPOFOL
Anesthesia: Monitor Anesthesia Care

## 2014-10-07 MED ORDER — LIDOCAINE HCL (CARDIAC) 20 MG/ML IV SOLN
INTRAVENOUS | Status: AC
  Filled 2014-10-07: qty 5

## 2014-10-07 MED ORDER — SODIUM CHLORIDE 0.9 % IV SOLN: INTRAVENOUS | Status: DC

## 2014-10-07 MED ORDER — METHYLENE BLUE 1 % INJ SOLN
INTRAMUSCULAR | Status: AC
  Filled 2014-10-07: qty 10

## 2014-10-07 MED ORDER — PROPOFOL 10 MG/ML IV BOLUS
INTRAVENOUS | Status: AC
  Filled 2014-10-07: qty 20

## 2014-10-07 MED ORDER — LIDOCAINE HCL (PF) 2 % IJ SOLN
INTRAMUSCULAR | Status: DC | PRN
Start: 2014-10-07 — End: 2014-10-07
  Administered 2014-10-07: 40 mg via INTRADERMAL

## 2014-10-07 MED ORDER — PROPOFOL INFUSION 10 MG/ML OPTIME: INTRAVENOUS | Status: DC | PRN

## 2014-10-07 MED ORDER — METHYLENE BLUE 1 % INJ SOLN
INTRAMUSCULAR | Status: DC | PRN
  Administered 2014-10-07: 8 mL via SUBMUCOSAL

## 2014-10-07 MED ORDER — PROPOFOL 10 MG/ML IV BOLUS
INTRAVENOUS | Status: DC | PRN
  Administered 2014-10-07 (×9): 50 mg via INTRAVENOUS

## 2014-10-07 MED ORDER — LACTATED RINGERS IV SOLN
INTRAVENOUS | Status: DC
  Administered 2014-10-07: 1000 mL via INTRAVENOUS

## 2014-10-07 SURGICAL SUPPLY — 22 items

## 2014-10-07 NOTE — Discharge Instructions (Signed)
No aspirin or anti-inflammatory medications for 2 weeks.   Colonoscopy, Care After Refer to this sheet in the next few weeks. These instructions provide you with information on caring for yourself after your procedure. Your health care provider may also give you more specific instructions. Your treatment has been planned according to current medical practices, but problems sometimes occur. Call your health care provider if you have any problems or questions after your procedure. WHAT TO EXPECT AFTER THE PROCEDURE  After your procedure, it is typical to have the following:  A small amount of blood in your stool.  Moderate amounts of gas and mild abdominal cramping or bloating. HOME CARE INSTRUCTIONS  Do not drive, operate machinery, or sign important documents for 24 hours.  You may shower and resume your regular physical activities, but move at a slower pace for the first 24 hours.  Take frequent rest periods for the first 24 hours.  Walk around or put a warm pack on your abdomen to help reduce abdominal cramping and bloating.  Drink enough fluids to keep your urine clear or pale yellow.  You may resume your normal diet as instructed by your health care provider. Avoid heavy or fried foods that are hard to digest.  Avoid drinking alcohol for 24 hours or as instructed by your health care provider.  Only take over-the-counter or prescription medicines as directed by your health care provider.  If a tissue sample (biopsy) was taken during your procedure:  Do not take aspirin or blood thinners for 7 days, or as instructed by your health care provider.  Do not drink alcohol for 7 days, or as instructed by your health care provider.  Eat soft foods for the first 24 hours. SEEK MEDICAL CARE IF: You have persistent spotting of blood in your stool 2-3 days after the procedure. SEEK IMMEDIATE MEDICAL CARE IF:  You have more than a small spotting of blood in your stool.  You pass large  blood clots in your stool.  Your abdomen is swollen (distended).  You have nausea or vomiting.  You have a fever.  You have increasing abdominal pain that is not relieved with medicine. Document Released: 09/23/2003 Document Revised: 11/29/2012 Document Reviewed: 10/16/2012 [facility] Patient Information 2015 [place], [place]. This information is not intended to replace advice given to you by your health care provider. Make sure you discuss any questions you have with your health care provider.

## 2014-10-07 NOTE — Anesthesia Preprocedure Evaluation (Signed)
Anesthesia Evaluation  Patient identified by MRN, date of birth, ID band Patient awake    Reviewed: Allergy & Precautions, NPO status , Patient's Chart, lab work & pertinent test results  Airway Mallampati: II  TM Distance: >3 FB Neck ROM: Full    Dental no notable dental hx.    Pulmonary former smoker,  breath sounds clear to auscultation  Pulmonary exam normal       Cardiovascular hypertension, Pt. on medications Normal cardiovascular examRhythm:Regular Rate:Normal     Neuro/Psych negative neurological ROS  negative psych ROS   GI/Hepatic negative GI ROS, Neg liver ROS,   Endo/Other  negative endocrine ROS  Renal/GU negative Renal ROS  negative genitourinary   Musculoskeletal negative musculoskeletal ROS (+)   Abdominal   Peds negative pediatric ROS (+)  Hematology negative hematology ROS (+)   Anesthesia Other Findings   Reproductive/Obstetrics negative OB ROS                             Anesthesia Physical Anesthesia Plan  ASA: III  Anesthesia Plan: MAC   Post-op Pain Management:    Induction:   Airway Management Planned: Simple Face Mask  Additional Equipment:   Intra-op Plan:   Post-operative Plan: Extubation in OR  Informed Consent: I have reviewed the patients History and Physical, chart, labs and discussed the procedure including the risks, benefits and alternatives for the proposed anesthesia with the patient or authorized representative who has indicated his/her understanding and acceptance.   Dental advisory given  Plan Discussed with: CRNA  Anesthesia Plan Comments:         Anesthesia Quick Evaluation

## 2014-10-07 NOTE — Op Note (Signed)
[facility] [place], [address]   COLONOSCOPY PROCEDURE REPORT     EXAM DATE: 10/07/2014  PATIENT NAME:      Paul Hicks, Paul Hicks           MR #:      [id number]  BIRTHDATE:       06/24/1950      VISIT #:     [id number]  ATTENDING:     Inda Castle, MD     STATUS:     outpatient ASSISTANT:      William Dalton and Laverta Baltimore   INDICATIONS:  The patient is a 64 yr old male here for a colonoscopy due to previously identified large, flat cecal polyp. PROCEDURE PERFORMED:     Colonoscopy with ablation Colonoscopy with snare polypectomy Submucosal injection, any substance placement of endoscopic clips. MEDICATIONS:     Monitored anesthesia care ESTIMATED BLOOD LOSS:     None  CONSENT: The patient understands the risks and benefits of the procedure and understands that these risks include, but are not limited to: sedation, allergic reaction, infection, perforation and/or bleeding. Alternative means of evaluation and treatment include, among others: physical exam, x-rays, and/or surgical intervention. The patient elects to proceed with this endoscopic procedure.  DESCRIPTION OF PROCEDURE: During intra-op preparation period all mechanical & medical equipment was checked for proper function. Hand hygiene and appropriate measures for infection prevention was taken. After the risks, benefits and alternatives of the procedure were thoroughly explained, Informed consent was verified, confirmed and timeout was successfully executed by the treatment team. A digital exam revealed no abnormalities of the rectum. The Pentax Ped Colon [id number] endoscope was introduced through the anus and advanced to the cecum, which was identified by both the appendix and ileocecal valve. (Suprep was used) excellent. The instrument was then slowly withdrawn as the colon was fully examined.Estimated blood loss is zero unless otherwise noted in this procedure  report.   COLON FINDINGS: A flat polyp measuring 35 mm in size was found at the cecum.  A saline Injection 9 ccwas given to lift the mucosal wall.  A polypectomy was performed in a piecemeal fashion using snare cautery.  The resection was complete, the polyp tissue was completely retrieved and sent to histology.  Destruction of tissue via ablation was done to remove any polyp remnants.   3 endoclips were placed to close the mucosal filling defect in an effort to reduce the risk for post-polypectomy bleeding.  There was moderate diverticulosis noted in the sigmoid colon with associated muscular hypertrophy.   The examination was otherwise normal. Retroflexed views revealed no abnormalities. The scope was then completely withdrawn from the patient and the procedure terminated. SCOPE WITHDRAWAL TIME:    ADVERSE EVENTS:      There were no immediate complications.  IMPRESSIONS:     1.  Flat polyp was found at the cecum; saline was given to lift the mucosal wall.; polypectomy was performed in a piecemeal fashion using snare cautery; Destruction of tissue via ablation was attempted 2.  There was moderate diverticulosis noted in the sigmoid colon   RECOMMENDATIONS:     Hold Aspirin and all other NSAIDS for 2 weeks.  RECALL:     Return in 6 months for Colonoscopy with ERBY  _____________________________ Inda Castle, MD eSigned:  Inda Castle, MD 10/07/2014 1:13 PM   cc:  Anastasia Pall, M.D.   CPT CODES: ICD CODES:  The ICD and CPT codes recommended by  this software are interpretations from the data that the clinical staff has captured with the software.  The verification of the translation of this report to the ICD and CPT codes and modifiers is the sole responsibility of the health care institution and practicing physician where this report was generated.  [place]. will not be held responsible for the validity of the ICD and CPT codes included on  this report.  AMA assumes no liability for data contained or not contained herein. CPT is a Designer, television/film set of the [employer].   PATIENT NAME:  Paul Hicks, Paul Hicks MR#: [id number]

## 2014-10-07 NOTE — H&P (Signed)
    History of Present Illness: Paul Hicks is a pleasant 64 year old white male who recently underwent colonoscopy where a 3 cm sessile, flat cecal polyp was seen. Biopsies demonstrated adenomatous changes only. He has no GI complaints.   Past Medical History  Diagnosis Date  . Anxiety   . GERD (gastroesophageal reflux disease)   . Hypertension   . Polyp of colon    Past Surgical History  Procedure Laterality Date  . Cataract extraction    . Shoulder arthroscopy w/ rotator cuff repair      both shoulders  . Right foot    . Colonoscopy     family history includes Colon cancer in his brother. There is no history of Esophageal cancer, Rectal cancer, or Stomach cancer. Current Outpatient Prescriptions  Medication Sig Dispense Refill  . cloNIDine (CATAPRES) 0.1 MG tablet Take 0.1 mg by mouth. 2 daily    . diltiazem (TIAZAC) 300 MG 24 hr capsule Take 300 mg by mouth. daily    . FLUoxetine (PROZAC) 20 MG tablet Take 20 mg by mouth. daily    . Multiple Vitamin (THERA) TABS Take 1 tablet by mouth. daily    . Omega-3 Fatty Acids (FISH OIL) 1000 MG CAPS Take 1 capsule by mouth. 2 daily    . ranitidine (ZANTAC) 300 MG tablet daily.    . valsartan-hydrochlorothiazide (DIOVAN-HCT) 160-12.5 MG per tablet Take 1 tablet by mouth. daily     No current facility-administered medications for this visit.   Allergies as of 09/06/2014  . (No Known Allergies)    reports that he has quit smoking. He has never used smokeless tobacco. He reports that he drinks about 0.6 oz of alcohol per week. He reports that he does not use illicit drugs.   Review of Systems: Pertinent positive and negative review of systems were noted in the above HPI section. All other review of systems were otherwise negative.  Vital signs were reviewed in today's medical record Physical Exam: General: Well developed , well nourished, no acute  distress Skin: anicteric Head: Normocephalic and atraumatic Eyes: sclerae anicteric, EOMI Ears: Normal auditory acuity Mouth: No deformity or lesions Neck: Supple, no masses or thyromegaly Lymph Nodes: no lymphadenopathy Lungs: Clear throughout to auscultation Heart: Regular rate and rhythm; no murmurs, rubs or bruits Gastroinestinal: Soft, non tender and non distended. No masses, hepatosplenomegaly or hernias noted. Normal Bowel sounds Rectal:deferred Musculoskeletal: Symmetrical with no gross deformities  Skin: No lesions on visible extremities Pulses: Normal pulses noted Extremities: No clubbing, cyanosis, edema or deformities noted Neurological: Alert oriented x 4, grossly nonfocal Cervical Nodes: No significant cervical adenopathy Inguinal Nodes: No significant inguinal adenopathy Psychological: Alert and cooperative. Normal mood and affect  See Assessment and Plan under Problem List                  Benign neoplasm of cecum - Inda Castle, MD a     Status: Written Related Problem: Benign neoplasm of cecum   Expand All Collapse All   Large sessile adenoma of the cecum.  20 minutes were spent with the patient and/or family. Greater than 50% was spent in counseling and coordination of care with the patient. We discussed means for removing the polyp including endoscopic mucosal resection and surgical resection. The iris of bleeding and perforation were discussed in detail associate with EMR. The patient has decided to proceed with colonoscopy for polyp removal.

## 2014-10-07 NOTE — Transfer of Care (Signed)
Immediate Anesthesia Transfer of Care Note  Patient: Paul Hicks  Procedure(s) Performed: Procedure(s): COLONOSCOPY WITH PROPOFOL (N/A) HOT HEMOSTASIS (ARGON PLASMA COAGULATION/BICAP) (N/A)  Patient Location: PACU  Anesthesia Type:MAC  Level of Consciousness:  sedated, patient cooperative and responds to stimulation  Airway & Oxygen Therapy:Patient Spontanous Breathing   Post-op Assessment:  Report given to PACU RN and Post -op Vital signs reviewed and stable  Post vital signs:  Reviewed and stable  Last Vitals:  Filed Vitals:   10/07/14 1111  BP: 190/103  Pulse: 93  Temp: 36.8 C  Resp: 20    Complications: No apparent anesthesia complications

## 2014-10-07 NOTE — Anesthesia Postprocedure Evaluation (Signed)
  Anesthesia Post-op Note  Patient: Paul Hicks  Procedure(s) Performed: Procedure(s) (LRB): COLONOSCOPY WITH PROPOFOL (N/A) HOT HEMOSTASIS (ARGON PLASMA COAGULATION/BICAP) (N/A)  Patient Location: PACU  Anesthesia Type: MAC  Level of Consciousness: awake and alert   Airway and Oxygen Therapy: Patient Spontanous Breathing  Post-op Pain: mild  Post-op Assessment: Post-op Vital signs reviewed, Patient's Cardiovascular Status Stable, Respiratory Function Stable, Patent Airway and No signs of Nausea or vomiting  Last Vitals:  Filed Vitals:   10/07/14 1340  BP: 164/98  Pulse: 84  Temp:   Resp: 17    Post-op Vital Signs: stable   Complications: No apparent anesthesia complications

## 2014-10-08 ENCOUNTER — Encounter (HOSPITAL_COMMUNITY): Payer: Self-pay | Admitting: Gastroenterology

## 2014-10-09 ENCOUNTER — Encounter: Payer: Self-pay | Admitting: Gastroenterology

## 2014-10-17 ENCOUNTER — Telehealth: Payer: Self-pay | Admitting: Gastroenterology

## 2014-10-17 NOTE — Telephone Encounter (Signed)
Spoke with patient and he had a colonoscopy with ablation on 10/07/14. He had not problems until 4 AM when he had a bowel movement with blood in it. No pain. He just had another bowel movement that looked like a "pickled beet." No pain. Spoke with Dr. Deatra Ina and if patient has any more bleeding, should go to ED. Patient notified and understands. He will call if he has any bleeding and is going to ED.

## 2015-01-30 ENCOUNTER — Other Ambulatory Visit: Payer: Self-pay

## 2015-01-30 ENCOUNTER — Telehealth: Payer: Self-pay | Admitting: Gastroenterology

## 2015-01-30 DIAGNOSIS — Z86010 Personal history of colonic polyps: Secondary | ICD-10-CM

## 2015-01-30 NOTE — Telephone Encounter (Signed)
I am happy to assume his care.  He does not need an OV unless he would feel more comfortable meeting me before the colonoscopy.   He needs a colonoscopy at hospital with ERBE and MAC sedation.  I know Dr. Raliegh Ip said 6 months but it would be fine to to do anytime now so I have a slot 12/20 at [facility] if he wants (might save him some $ since its before end of year).  Otherwise Jan hosp week works.

## 2015-01-30 NOTE — Telephone Encounter (Signed)
The patient is excited to meet Dr Carlean Purl. He will check with his wife about the scheduling. i have entered the case, hospital orders and referral. He will call me tomorrow.

## 2015-01-30 NOTE — Telephone Encounter (Signed)
Dr Deatra Ina patient.  Please review the patient's last colonoscopy . He has been encouraged by a nurse friend to establish with you. He would like to pursue this. He is a super nice guy. Would you be willing to take on his GI care?

## 2015-01-31 ENCOUNTER — Other Ambulatory Visit: Payer: Self-pay

## 2015-01-31 MED ORDER — NA SULFATE-K SULFATE-MG SULF 17.5-3.13-1.6 GM/177ML PO SOLN: 1.0000 | Freq: Once | ORAL | Status: DC

## 2015-01-31 NOTE — Telephone Encounter (Signed)
Patient call was put into my voicemail. No phone note. Call to the patient. He does want the 02/11/15 appointment for the colonoscopy with ERBE. He is instructed to pick up his prep at his new pharmacy [facility] in [place]. He will arrive at [place] at 9:30 am on 02/11/15 to have his procedure.

## 2015-02-03 ENCOUNTER — Encounter (HOSPITAL_COMMUNITY): Payer: Self-pay | Admitting: *Deleted

## 2015-02-11 ENCOUNTER — Ambulatory Visit (HOSPITAL_COMMUNITY)
Admission: RE | Admit: 2015-02-11 | Discharge: 2015-02-11 | Disposition: A | Discharge status: Home / Self Care | Payer: Medicare HMO | Source: Ambulatory Visit | Attending: Internal Medicine | Admitting: Internal Medicine

## 2015-02-11 ENCOUNTER — Encounter (HOSPITAL_COMMUNITY): Payer: Self-pay | Admitting: *Deleted

## 2015-02-11 ENCOUNTER — Ambulatory Visit (HOSPITAL_COMMUNITY): Payer: Medicare HMO | Admitting: Anesthesiology

## 2015-02-11 ENCOUNTER — Encounter (HOSPITAL_COMMUNITY)
Admission: RE | Disposition: A | Discharge status: Home / Self Care | Payer: Self-pay | Source: Ambulatory Visit | Attending: Internal Medicine

## 2015-02-11 DIAGNOSIS — Z6838 Body mass index (BMI) 38.0-38.9, adult: Secondary | ICD-10-CM | POA: Insufficient documentation

## 2015-02-11 DIAGNOSIS — Z09 Encounter for follow-up examination after completed treatment for conditions other than malignant neoplasm: Secondary | ICD-10-CM | POA: Diagnosis present

## 2015-02-11 DIAGNOSIS — Z86010 Personal history of colonic polyps: Secondary | ICD-10-CM | POA: Diagnosis not present

## 2015-02-11 DIAGNOSIS — K5730 Diverticulosis of large intestine without perforation or abscess without bleeding: Secondary | ICD-10-CM | POA: Diagnosis not present

## 2015-02-11 DIAGNOSIS — K219 Gastro-esophageal reflux disease without esophagitis: Secondary | ICD-10-CM | POA: Diagnosis not present

## 2015-02-11 DIAGNOSIS — F419 Anxiety disorder, unspecified: Secondary | ICD-10-CM | POA: Insufficient documentation

## 2015-02-11 DIAGNOSIS — D120 Benign neoplasm of cecum: Secondary | ICD-10-CM | POA: Insufficient documentation

## 2015-02-11 DIAGNOSIS — Z79899 Other long term (current) drug therapy: Secondary | ICD-10-CM | POA: Insufficient documentation

## 2015-02-11 DIAGNOSIS — I10 Essential (primary) hypertension: Secondary | ICD-10-CM | POA: Insufficient documentation

## 2015-02-11 DIAGNOSIS — Z800 Family history of malignant neoplasm of digestive organs: Secondary | ICD-10-CM | POA: Insufficient documentation

## 2015-02-11 DIAGNOSIS — Z87891 Personal history of nicotine dependence: Secondary | ICD-10-CM | POA: Diagnosis not present

## 2015-02-11 HISTORY — PX: COLONOSCOPY: SHX5424

## 2015-02-11 HISTORY — PX: HOT HEMOSTASIS: SHX5433

## 2015-02-11 SURGERY — COLONOSCOPY
Anesthesia: Monitor Anesthesia Care

## 2015-02-11 MED ORDER — SODIUM CHLORIDE 0.9 % IV SOLN: INTRAVENOUS | Status: DC

## 2015-02-11 MED ORDER — LACTATED RINGERS IV SOLN
INTRAVENOUS | Status: DC
  Administered 2015-02-11: 1000 mL via INTRAVENOUS

## 2015-02-11 MED ORDER — PROPOFOL 500 MG/50ML IV EMUL
INTRAVENOUS | Status: DC | PRN
  Administered 2015-02-11: 200 ug/kg/min via INTRAVENOUS

## 2015-02-11 MED ORDER — PROPOFOL 10 MG/ML IV BOLUS
INTRAVENOUS | Status: AC
  Filled 2015-02-11: qty 20

## 2015-02-11 NOTE — Anesthesia Postprocedure Evaluation (Signed)
Anesthesia Post Note  Patient: IZEKIEL GERARDOT  Procedure(s) Performed: Procedure(s) (LRB): COLONOSCOPY (N/A) HOT HEMOSTASIS (ARGON PLASMA COAGULATION/BICAP) (N/A)  Patient location during evaluation: PACU Anesthesia Type: MAC Level of consciousness: awake and alert Pain management: pain level controlled Vital Signs Assessment: post-procedure vital signs reviewed and stable Respiratory status: spontaneous breathing, nonlabored ventilation, respiratory function stable and patient connected to nasal cannula oxygen Cardiovascular status: blood pressure returned to baseline and stable Postop Assessment: no signs of nausea or vomiting Anesthetic complications: no    Last Vitals:  Filed Vitals:   02/11/15 1140 02/11/15 1150  BP: 134/78 150/77  Pulse: 69 67  Temp:    Resp: 17 13    Last Pain: There were no vitals filed for this visit.               Shanah Guimaraes JENNETTE

## 2015-02-11 NOTE — Discharge Instructions (Signed)
° °  There was a small part of the polyp remaining that I removed and destroyed. Still looks benign. I will let you know results and plans by mail - I anticipate looking again in 2017.   I appreciate the opportunity to care for you. Gatha Mayer, MD, FACG  YOU HAD AN ENDOSCOPIC PROCEDURE TODAY: Refer to the procedure report and other information in the discharge instructions given to you for any specific questions about what was found during the examination. If this information does not answer your questions, please call Dr. Celesta Aver office at [phone] to clarify.   YOU SHOULD EXPECT: Some feelings of bloating in the abdomen. Passage of more gas than usual. Walking can help get rid of the air that was put into your GI tract during the procedure and reduce the bloating. If you had a lower endoscopy (such as a colonoscopy or flexible sigmoidoscopy) you may notice spotting of blood in your stool or on the toilet paper. Some abdominal soreness may be present for a day or two, also.  DIET: Your first meal following the procedure should be a light meal and then it is ok to progress to your normal diet. A half-sandwich or bowl of soup is an example of a good first meal. Heavy or fried foods are harder to digest and may make you feel nauseous or bloated. Drink plenty of fluids but you should avoid alcoholic beverages for 24 hours.   ACTIVITY: Your care partner should take you home directly after the procedure. You should plan to take it easy, moving slowly for the rest of the day. You can resume normal activity the day after the procedure however YOU SHOULD NOT DRIVE, use power tools, machinery or perform tasks that involve climbing or major physical exertion for 24 hours (because of the sedation medicines used during the test).   SYMPTOMS TO REPORT IMMEDIATELY: A gastroenterologist can be reached at any hour. Please call [phone]  for any of the following symptoms:  Following lower  endoscopy (colonoscopy, flexible sigmoidoscopy) Excessive amounts of blood in the stool  Significant tenderness, worsening of abdominal pains  Swelling of the abdomen that is new, acute  Fever of 100 or higher    FOLLOW UP:  If any biopsies were taken you will be contacted by phone or by letter within the next 1-3 weeks. Call [phone]  if you have not heard about the biopsies in 3 weeks.  Please also call with any specific questions about appointments or follow up tests.

## 2015-02-11 NOTE — Op Note (Signed)
[facility] [place], [address]   COLONOSCOPY PROCEDURE REPORT  PATIENT: [name], Paul Hicks  MR#: [id number] BIRTHDATE: 03/16/1950 , 55  yrs. old GENDER: male ENDOSCOPIST: Gatha Mayer, MD, [facility] PROCEDURE DATE:  02/11/2015 PROCEDURE:   Colonoscopy, diagnostic, Colonoscopy with snare polypectomy, and Colonoscopy with ablation First Screening Colonoscopy - Avg.  risk and is 50 yrs.  old or older - No.  Prior Negative Screening - Now for repeat screening. N/A  History of Adenoma - Now for follow-up colonoscopy & has been > or = to 3 yrs.  No.  It has been less than 3 yrs since last colonoscopy.  Medical reason.  Polyps removed today? Yes ASA CLASS:   Class III INDICATIONS:Surveillance due to prior colonic neoplasia, PH Colon Adenoma, and FH Colon or Rectal Adenocarcinoma. MEDICATIONS: Monitored anesthesia care and Per Anesthesia  DESCRIPTION OF PROCEDURE:   After the risks benefits and alternatives of the procedure were thoroughly explained, informed consent was obtained.  The digital rectal exam revealed no abnormalities of the rectum.   The Pentax Ped Colon [id number] endoscope was introduced through the anus and advanced to the cecum, which was identified by both the appendix and ileocecal valve. No adverse events experienced.   The quality of the prep was excellent.  (Suprep was used)  The instrument was then slowly withdrawn as the colon was fully examined. Estimated blood loss is zero unless otherwise noted in this procedure report.  COLON FINDINGS: A residual flat/sessile polyp measuring 10 mm in size was found at the cecum near appendiceal orifice with surroudning scar from prior polypectomy.  A polypectomy was performed using snare cautery.  A polypectomy was performed using snare cautery and cold forceps.  The resection was complete, the polyp tissue was completely retrieved and sent to histology. Destruction of tissue via ablation was performed  with APC at polypectomy site. Argon plasma coagulation was used.  Care was given to ensure that the lumen was suctioned well.   There was diverticulosis noted in the left colon.   The examination was otherwise normal.  Retroflexed views revealed no abnormalities. The time to cecum = 2.1 Withdrawal time = 20.8   The scope was withdrawn and the procedure completed. COMPLICATIONS: There were no immediate complications.  ENDOSCOPIC IMPRESSION: 1.   Flat Sessile residual polyp was found at the cecum; polypectomy was performed using snare cautery; polypectomy was performed using snare cautery; Destruction of tissue via ablation with APC performed. 2.   Diverticulosis was noted in the left colon 3.   The examination was otherwise normal - good prep  RECOMMENDATIONS: 1.  Hold Aspirin and all other NSAIDS for 2 weeks. 2.  Await pathology results 3.  Timing of repeat colonoscopy will be determined by pathology findings.  He has FHx CRCA brother also eSigned:  Gatha Mayer, MD, [facility] 02/11/2015 11:35 AM   cc: The Patient   and Dr. Anastasia Pall

## 2015-02-11 NOTE — Transfer of Care (Signed)
Immediate Anesthesia Transfer of Care Note  Patient: Paul Hicks  Procedure(s) Performed: Procedure(s): COLONOSCOPY (N/A) HOT HEMOSTASIS (ARGON PLASMA COAGULATION/BICAP) (N/A)  Patient Location: PACU  Anesthesia Type:MAC  Level of Consciousness: awake, alert , oriented and patient cooperative  Airway & Oxygen Therapy: Patient Spontanous Breathing and Patient connected to face mask oxygen  Post-op Assessment: Report given to RN, Post -op Vital signs reviewed and stable and Patient moving all extremities X 4  Post vital signs: stable  Last Vitals:  Filed Vitals:   02/11/15 0954  BP: 145/83  Pulse: 77  Temp: 36.8 C  Resp: 10    Complications: No apparent anesthesia complications

## 2015-02-11 NOTE — H&P (Signed)
[facility] History and Physical   Primary Care Physician:  Chesley Noon, MD   Reason for Procedure:   polyp f/u  Plan:    Colonoscopy The risks and benefits as well as alternatives of endoscopic procedure(s) have been discussed and reviewed. All questions answered. The patient agrees to proceed.   HPI: Paul Hicks is a 64 y.o. male here to f/u large right colon polyp removed and ablated in 09/2014.   Past Medical History  Diagnosis Date  . Anxiety   . GERD (gastroesophageal reflux disease)   . Hypertension   . Polyp of colon     Past Surgical History  Procedure Laterality Date  . Cataract extraction    . Shoulder arthroscopy w/ rotator cuff repair      both shoulders  . Right foot    . Colonoscopy    . Colonoscopy with propofol N/A 10/07/2014    Procedure: COLONOSCOPY WITH PROPOFOL;  Surgeon: Inda Castle, MD;  Location: [facility] END[facility]COPY;  Service: Endoscopy;  Laterality: N/A;  . Hot hemostasis N/A 10/07/2014    Procedure: HOT HEMOSTASIS (ARGON PLASMA COAGULATION/BICAP);  Surgeon: Inda Castle, MD;  Location: [facility];  Service: Endoscopy;  Laterality: N/A;    Prior to Admission medications   Medication Sig Start Date End Date Taking? Authorizing Provider  buPROPion (WELLBUTRIN XL) 300 MG 24 hr tablet Take 300 mg by mouth daily. 01/13/15  Yes Historical Provider, MD  CINNAMON PO Take 2 tablets by mouth daily.   Yes Historical Provider, MD  cloNIDine (CATAPRES) 0.1 MG tablet Take 0.1 mg by mouth 2 (two) times daily.    Yes Historical Provider, MD  diltiazem (TIAZAC) 300 MG 24 hr capsule Take 300 mg by mouth. daily   Yes Historical Provider, MD  Multiple Vitamin (THERA) TABS Take 1 tablet by mouth. daily   Yes Historical Provider, MD  Na Sulfate-K Sulfate-Mg Sulf (SUPREP BOWEL PREP) SOLN Take 1 kit by mouth once. 01/31/15  Yes Gatha Mayer, MD  Omega-3 Fatty Acids (FISH OIL) 1000 MG CAPS Take 2,000 mg by mouth daily.    Yes Historical Provider, MD   ranitidine (ZANTAC) 300 MG tablet Take 300 mg by mouth daily.  03/24/14  Yes Historical Provider, MD  valsartan-hydrochlorothiazide (DIOVAN-HCT) 160-12.5 MG per tablet Take 1 tablet by mouth. daily   Yes Historical Provider, MD    Current Facility-Administered Medications  Medication Dose Route Frequency Provider Last Rate Last Dose  . 0.9 %  sodium chloride infusion   Intravenous Continuous Gatha Mayer, MD      . lactated ringers infusion   Intravenous Continuous Gatha Mayer, MD 10 mL/hr at 02/11/15 1010 1,000 mL at 02/11/15 1010    Allergies as of 01/30/2015  . (No Known Allergies)    Family History  Problem Relation Age of Onset  . Colon cancer Brother   . Esophageal cancer Neg Hx   . Rectal cancer Neg Hx   . Stomach cancer Neg Hx     Social History   Social History  . Marital Status: Married    Spouse Name: N/A  . Number of Children: N/A  . Years of Education: N/A   Occupational History  . Not on file.   Social History Main Topics  . Smoking status: Former Research scientist (life sciences)  . Smokeless tobacco: Former Systems developer    Quit date: 02/02/1981  . Alcohol Use: 0.6 oz/week    1 Cans of beer per week     Comment: occasionally  .  Drug Use: No  . Sexual Activity: Not on file   Other Topics Concern  . Not on file   Social History Narrative    Review of Systems:  All other review of systems negative except as mentioned in the HPI.  Physical Exam: Vital signs in last 24 hours: Temp:  [98.3 F (36.8 C)] 98.3 F (36.8 C) (12/20 0954) Pulse Rate:  [77] 77 (12/20 0954) Resp:  [10] 10 (12/20 0954) BP: (145)/(83) 145/83 mmHg (12/20 0954) SpO2:  [96 %] 96 % (12/20 0954) Weight:  [286 lb (129.729 kg)] 286 lb (129.729 kg) (12/20 1638)   General:   Alert,  Well-developed, well-nourished, pleasant and cooperative in NAD Lungs:  Clear throughout to auscultation.   Heart:  Regular rate and rhythm; no murmurs, clicks, rubs,  or gallops. Abdomen:  Soft, nontender and nondistended.  Normal bowel sounds.   Neuro/Psych:  Alert and cooperative. Normal mood and affect. A and O x 3   @Jezabella Schriever  Simonne Maffucci, MD, [facility] [phone] (pager) 02/11/2015 10:52 AM@

## 2015-02-11 NOTE — Anesthesia Preprocedure Evaluation (Addendum)
Anesthesia Evaluation  Patient identified by MRN, date of birth, ID band Patient awake    Reviewed: Allergy & Precautions, NPO status , Patient's Chart, lab work & pertinent test results  History of Anesthesia Complications Negative for: history of anesthetic complications  Airway Mallampati: II  TM Distance: >3 FB Neck ROM: Full    Dental no notable dental hx. (+) Dental Advisory Given   Pulmonary former smoker,    Pulmonary exam normal breath sounds clear to auscultation       Cardiovascular hypertension, Pt. on medications Normal cardiovascular exam Rhythm:Regular Rate:Normal     Neuro/Psych PSYCHIATRIC DISORDERS Anxiety negative neurological ROS     GI/Hepatic Neg liver ROS, GERD  Medicated and Controlled,  Endo/Other  Morbid obesity  Renal/GU negative Renal ROS  negative genitourinary   Musculoskeletal negative musculoskeletal ROS (+)   Abdominal   Peds negative pediatric ROS (+)  Hematology negative hematology ROS (+)   Anesthesia Other Findings   Reproductive/Obstetrics negative OB ROS                            Anesthesia Physical Anesthesia Plan  ASA: III  Anesthesia Plan: MAC   Post-op Pain Management:    Induction: Intravenous  Airway Management Planned: Nasal Cannula  Additional Equipment:   Intra-op Plan:   Post-operative Plan:   Informed Consent: I have reviewed the patients History and Physical, chart, labs and discussed the procedure including the risks, benefits and alternatives for the proposed anesthesia with the patient or authorized representative who has indicated his/her understanding and acceptance.   Dental advisory given  Plan Discussed with: CRNA  Anesthesia Plan Comments:         Anesthesia Quick Evaluation

## 2015-02-12 ENCOUNTER — Encounter: Payer: Self-pay | Admitting: Internal Medicine

## 2015-02-12 ENCOUNTER — Encounter (HOSPITAL_COMMUNITY): Payer: Self-pay | Admitting: Internal Medicine

## 2015-02-12 DIAGNOSIS — D120 Benign neoplasm of cecum: Secondary | ICD-10-CM

## 2015-02-12 NOTE — Assessment & Plan Note (Signed)
Removed 09/2014 + APC 01/2015 - 1 cm residual removed/ablated - relook summer 2017

## 2015-02-12 NOTE — Progress Notes (Signed)
Quick Note:  Residual adenoma removed/destroyed Repeat colonoscopy Summer 2017 ______

## 2015-08-19 ENCOUNTER — Telehealth: Payer: Self-pay | Admitting: Internal Medicine

## 2015-08-19 NOTE — Telephone Encounter (Signed)
Left message on machine to call back  

## 2015-08-21 NOTE — Telephone Encounter (Signed)
Pt aware Dr Carlean Purl to review the recall and he will be called with a response.  He was notified that Dr Carlean Purl is out of the office until Monday

## 2015-09-09 ENCOUNTER — Telehealth: Payer: Self-pay | Admitting: Internal Medicine

## 2015-09-09 ENCOUNTER — Other Ambulatory Visit: Payer: Self-pay

## 2015-09-09 DIAGNOSIS — Z86010 Personal history of colonic polyps: Secondary | ICD-10-CM

## 2015-09-09 NOTE — Telephone Encounter (Signed)
Where does colonoscopy need to be here or hospital?

## 2015-09-09 NOTE — Telephone Encounter (Signed)
Patient is scheduled for 11/04/15 11:30. He is also scheduled for a pre-visit for 10/20/15 11:00

## 2015-09-09 NOTE — Telephone Encounter (Signed)
Hospital please apc needed

## 2015-10-20 ENCOUNTER — Ambulatory Visit (AMBULATORY_SURGERY_CENTER): Payer: Self-pay

## 2015-10-20 VITALS — Ht 72.0 in | Wt 300.6 lb

## 2015-10-20 DIAGNOSIS — Z800 Family history of malignant neoplasm of digestive organs: Secondary | ICD-10-CM

## 2015-10-20 DIAGNOSIS — Z86010 Personal history of colonic polyps: Secondary | ICD-10-CM

## 2015-10-20 MED ORDER — NA SULFATE-K SULFATE-MG SULF 17.5-3.13-1.6 GM/177ML PO SOLN: ORAL | 0 refills | Status: DC

## 2015-10-20 NOTE — Progress Notes (Signed)
Per pt, no allergies to soy or egg products.Pt not taking any weight loss meds or using  O2 at home. 

## 2015-10-28 ENCOUNTER — Encounter (HOSPITAL_COMMUNITY): Payer: Self-pay | Admitting: *Deleted

## 2015-11-04 ENCOUNTER — Ambulatory Visit (HOSPITAL_COMMUNITY): Payer: Medicare HMO | Admitting: Certified Registered"

## 2015-11-04 ENCOUNTER — Encounter (HOSPITAL_COMMUNITY): Payer: Self-pay | Admitting: Certified Registered"

## 2015-11-04 ENCOUNTER — Ambulatory Visit (HOSPITAL_COMMUNITY)
Admission: RE | Admit: 2015-11-04 | Discharge: 2015-11-04 | Disposition: A | Discharge status: Home / Self Care | Payer: Medicare HMO | Source: Ambulatory Visit | Attending: Internal Medicine | Admitting: Internal Medicine

## 2015-11-04 ENCOUNTER — Encounter (HOSPITAL_COMMUNITY)
Admission: RE | Disposition: A | Discharge status: Home / Self Care | Payer: Self-pay | Source: Ambulatory Visit | Attending: Internal Medicine

## 2015-11-04 DIAGNOSIS — F419 Anxiety disorder, unspecified: Secondary | ICD-10-CM | POA: Insufficient documentation

## 2015-11-04 DIAGNOSIS — D120 Benign neoplasm of cecum: Secondary | ICD-10-CM

## 2015-11-04 DIAGNOSIS — Z800 Family history of malignant neoplasm of digestive organs: Secondary | ICD-10-CM | POA: Insufficient documentation

## 2015-11-04 DIAGNOSIS — K219 Gastro-esophageal reflux disease without esophagitis: Secondary | ICD-10-CM | POA: Diagnosis not present

## 2015-11-04 DIAGNOSIS — D125 Benign neoplasm of sigmoid colon: Secondary | ICD-10-CM | POA: Diagnosis not present

## 2015-11-04 DIAGNOSIS — Z87891 Personal history of nicotine dependence: Secondary | ICD-10-CM | POA: Diagnosis not present

## 2015-11-04 DIAGNOSIS — Z86010 Personal history of colonic polyps: Secondary | ICD-10-CM | POA: Insufficient documentation

## 2015-11-04 DIAGNOSIS — Z79899 Other long term (current) drug therapy: Secondary | ICD-10-CM | POA: Diagnosis not present

## 2015-11-04 DIAGNOSIS — K5730 Diverticulosis of large intestine without perforation or abscess without bleeding: Secondary | ICD-10-CM | POA: Diagnosis not present

## 2015-11-04 DIAGNOSIS — Z6841 Body Mass Index (BMI) 40.0 and over, adult: Secondary | ICD-10-CM | POA: Diagnosis not present

## 2015-11-04 DIAGNOSIS — I10 Essential (primary) hypertension: Secondary | ICD-10-CM | POA: Diagnosis not present

## 2015-11-04 DIAGNOSIS — Z1211 Encounter for screening for malignant neoplasm of colon: Secondary | ICD-10-CM | POA: Insufficient documentation

## 2015-11-04 HISTORY — PX: COLONOSCOPY WITH PROPOFOL: SHX5780

## 2015-11-04 SURGERY — COLONOSCOPY WITH PROPOFOL
Anesthesia: Monitor Anesthesia Care

## 2015-11-04 MED ORDER — PROPOFOL 500 MG/50ML IV EMUL
INTRAVENOUS | Status: DC | PRN
  Administered 2015-11-04: 100 ug/kg/min via INTRAVENOUS

## 2015-11-04 MED ORDER — LACTATED RINGERS IV SOLN
INTRAVENOUS | Status: DC
  Administered 2015-11-04: 1000 mL via INTRAVENOUS

## 2015-11-04 MED ORDER — PROPOFOL 10 MG/ML IV BOLUS
INTRAVENOUS | Status: DC | PRN
  Administered 2015-11-04: 80 mg via INTRAVENOUS

## 2015-11-04 MED ORDER — SODIUM CHLORIDE 0.9 % IV SOLN: INTRAVENOUS | Status: DC

## 2015-11-04 SURGICAL SUPPLY — 22 items

## 2015-11-04 NOTE — H&P (Signed)
[facility] History and Physical   Primary Care Physician:  Chesley Noon, MD   Reason for Procedure:  F/u colon polyp  Plan:    Colonoscopy The risks and benefits as well as alternatives of endoscopic procedure(s) have been discussed and reviewed. All questions answered. The patient agrees to proceed.      HPI: Paul Hicks is a 65 y.o. 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    Past Medical History:  Diagnosis Date  . Anxiety   . GERD (gastroesophageal reflux disease)   . Hypertension   . Polyp of colon     Past Surgical History:  Procedure Laterality Date  . CATARACT EXTRACTION Bilateral   . COLONOSCOPY    . COLONOSCOPY N/A 02/11/2015   Procedure: COLONOSCOPY;  Surgeon: Gatha Mayer, MD;  Location: [facility];  Service: Endoscopy;  Laterality: N/A;  . COLONOSCOPY WITH PROPOFOL N/A 10/07/2014   Procedure: COLONOSCOPY WITH PROPOFOL;  Surgeon: Inda Castle, MD;  Location: [facility];  Service: Endoscopy;  Laterality: N/A;  . HOT HEMOSTASIS N/A 10/07/2014   Procedure: HOT HEMOSTASIS (ARGON PLASMA COAGULATION/BICAP);  Surgeon: Inda Castle, MD;  Location: [facility];  Service: Endoscopy;  Laterality: N/A;  . HOT HEMOSTASIS N/A 02/11/2015   Procedure: HOT HEMOSTASIS (ARGON PLASMA COAGULATION/BICAP);  Surgeon: Gatha Mayer, MD;  Location: [facility];  Service: Endoscopy;  Laterality: N/A;  . right foot    . SHOULDER ARTHROSCOPY W/ ROTATOR CUFF REPAIR     both shoulders    Prior to Admission medications   Medication Sig Start Date End Date Taking? Authorizing Provider  buPROPion (WELLBUTRIN XL) 300 MG 24 hr tablet Take 300 mg by mouth daily. 01/13/15  Yes Historical Provider, MD  CINNAMON PO Take 2 tablets by mouth daily.   Yes Historical Provider, MD  cloNIDine (CATAPRES) 0.1 MG tablet Take 0.1 mg by mouth 2 (two) times daily.    Yes Historical Provider, MD  diltiazem (TIAZAC) 300 MG 24 hr capsule  Take 300 mg by mouth. daily   Yes Historical Provider, MD  FLUoxetine (PROZAC) 20 MG capsule Take 20 mg by mouth daily.   Yes Historical Provider, MD  Multiple Vitamin (THERA) TABS Take 1 tablet by mouth. daily   Yes Historical Provider, MD  Na Sulfate-K Sulfate-Mg Sulf (SUPREP BOWEL PREP KIT) 17.5-3.13-1.6 GM/180ML SOLN Suprep as directed / no substitutions 10/20/15  Yes Gatha Mayer, MD  Omega-3 Fatty Acids (FISH OIL) 1000 MG CAPS Take 2,000 mg by mouth daily.    Yes Historical Provider, MD  valsartan-hydrochlorothiazide (DIOVAN-HCT) 160-12.5 MG per tablet Take 1 tablet by mouth. daily   Yes Historical Provider, MD  ranitidine (ZANTAC) 300 MG tablet Take 300 mg by mouth daily.  03/24/14   Historical Provider, MD    No current facility-administered medications for this encounter.     Allergies as of 09/09/2015  . (No Known Allergies)    Family History  Problem Relation Age of Onset  . Colon cancer Brother   . Esophageal cancer Neg Hx   . Rectal cancer Neg Hx   . Stomach cancer Neg Hx     Social History   Social History  . Marital status: Married    Spouse name: N/A  . Number of children: N/A  . Years of education: N/A   Occupational History  . Not on file.   Social History Main Topics  . Smoking status: Former  Smoker    Quit date: 10/19/1980  . Smokeless tobacco: Never Used  . Alcohol use 0.6 oz/week    1 Cans of beer per week     Comment: occasionally  . Drug use: No  . Sexual activity: Not on file   Other Topics Concern  . Not on file   Social History Narrative  . No narrative on file    Review of Systems:  All other review of systems negative except as mentioned in the HPI.  Physical Exam: Vital signs in last 24 hours:     General:   Alert,  Well-developed, well-nourished, pleasant and cooperative in NAD Lungs:  Clear throughout to auscultation.   Heart:  Regular rate and rhythm; no murmurs, clicks, rubs,  or gallops. Abdomen:  Soft, nontender and  nondistended. Normal bowel sounds.   Neuro/Psych:  Alert and cooperative. Normal mood and affect. A and O x 3   _0  E. Carlean Purl, MD, [facility] [phone] (pager) 11/04/2015 10:30 AM@

## 2015-11-04 NOTE — Transfer of Care (Signed)
Immediate Anesthesia Transfer of Care Note  Patient: Paul Hicks  Procedure(s) Performed: Procedure(s): COLONOSCOPY WITH PROPOFOL (N/A)  Patient Location: PACU  Anesthesia Type:MAC  Level of Consciousness: awake, alert  and oriented  Airway & Oxygen Therapy: Patient Spontanous Breathing and Patient connected to nasal cannula oxygen  Post-op Assessment: Report given to RN and Post -op Vital signs reviewed and stable  Post vital signs: Reviewed and stable  Last Vitals:  Vitals:   11/04/15 1030  BP: (!) 172/97  Pulse: 79  Resp: 10  Temp: 36.8 C    Last Pain:  Vitals:   11/04/15 1030  TempSrc: Oral         Complications: No apparent anesthesia complications

## 2015-11-04 NOTE — Discharge Instructions (Signed)
° °  I think the large polyp is gone - biopsies taken to see. I found and removed one more tiney polyp. You still have diverticulosis.  I will let you know pathology results and when to have another routine colonoscopy by mail.  I appreciate the opportunity to care for you. Gatha Mayer, MD, [facility]

## 2015-11-04 NOTE — Op Note (Signed)
[facility] Patient Name: Paul Hicks Procedure Date: 11/04/2015 MRN: [id number] Attending MD: Gatha Mayer , MD Date of Birth: 28-Feb-1950 CSN: [id number] Age: 65 Admit Type: Outpatient Procedure:                Colonoscopy Indications:              Surveillance: Piecemeal removal of large sessile                            adenoma last colonoscopy (< 3 yrs), Surveillance:                            Incomplete removal of large sessile adenoma last                            colonoscopy (<3 yrs) Providers:                Gatha Mayer, MD, Cleda Daub, RN, Ralene Bathe, Technician, Karis Juba, CRNA Referring MD:              Medicines:                Propofol per Anesthesia, Monitored Anesthesia Care Complications:            No immediate complications. Estimated Blood Loss:     Estimated blood loss was minimal. Procedure:                Pre-Anesthesia Assessment:                           - Prior to the procedure, a History and Physical                            was performed, and patient medications and                            allergies were reviewed. The patient's tolerance of                            previous anesthesia was also reviewed. The risks                            and benefits of the procedure and the sedation                            options and risks were discussed with the patient.                            All questions were answered, and informed consent                            was obtained. Prior Anticoagulants: The patient has  taken no previous anticoagulant or antiplatelet                            agents. ASA Grade Assessment: III - A patient with                            severe systemic disease. After reviewing the risks                            and benefits, the patient was deemed in                            satisfactory condition to undergo the procedure.                       After obtaining informed consent, the colonoscope                            was passed under direct vision. Throughout the                            procedure, the patient's blood pressure, pulse, and                            oxygen saturations were monitored continuously. The                            [id number] scope was introduced through                            the anus and advanced to the the cecum, identified                            by appendiceal orifice and ileocecal valve. The                            colonoscopy was performed without difficulty. The                            patient tolerated the procedure well. The ileocecal                            valve, appendiceal orifice, and rectum were                            photographed. The quality of the bowel preparation                            was good. The ileocecal valve, appendiceal orifice,                            and rectum were photographed. Scope In: 10:53:42 AM Scope Out: 11:08:44 AM Scope Withdrawal Time: 0 hours 12 minutes 48 seconds  Total Procedure Duration: 0 hours 15 minutes 2  seconds  Findings:      The perianal and digital rectal examinations were normal. Pertinent       negatives include normal prostate (size, shape, and consistency).      A post polypectomy scar was found in the cecum. The scar tissue was       healthy in appearance. There was no evidence of the previous polyp. This       was biopsied with a cold forceps for histology. Verification of patient       identification for the specimen was done. Estimated blood loss was       minimal.      A 2 mm polyp was found in the sigmoid colon. The polyp was sessile. The       polyp was removed with a cold snare. Resection and retrieval were       complete. Verification of patient identification for the specimen was       done. Estimated blood loss was minimal.      Diverticula were found in the sigmoid  colon. There was narrowing of the       colon in association with the diverticular opening.      The exam was otherwise without abnormality on direct and retroflexion       views. Impression:               - Post-polypectomy scar in the cecum. Biopsied. No                            visible polyp remaining.                           - One 2 mm polyp in the sigmoid colon, removed with                            a cold snare. Resected and retrieved.                           - Severe diverticulosis in the sigmoid colon. There                            was narrowing of the colon in association with the                            diverticular opening.                           - The examination was otherwise normal on direct                            and retroflexion views.                           - Family history of colon cancer in brother                           - Will cc: Dr. Anastasia Pall Moderate Sedation:      Please see anesthesia notes, moderate sedation not given Recommendation:           -  Patient has a contact number available for                            emergencies. The signs and symptoms of potential                            delayed complications were discussed with the                            patient. Return to normal activities tomorrow.                            Written discharge instructions were provided to the                            patient.                           - Resume previous diet.                           - Continue present medications.                           - Repeat colonoscopy is recommended for                            surveillance. The colonoscopy date will be                            determined after pathology results from today's                            exam become available for review. Procedure Code(s):        --- Professional ---                           [phone], Colonoscopy, flexible; with removal of                             tumor(s), polyp(s), or other lesion(s) by snare                            technique                           45380, 57, Colonoscopy, flexible; with biopsy,                            single or multiple Diagnosis Code(s):        --- Professional ---                           [id number], Other specified postprocedural states                           D12.5, Benign neoplasm  of sigmoid colon                           Z86.010, Personal history of colonic polyps                           K57.30, Diverticulosis of large intestine without                            perforation or abscess without bleeding CPT copyright 2016 [employer] Association. All rights reserved. The codes documented in this report are preliminary and upon coder review may  be revised to meet current compliance requirements. Gatha Mayer, MD 11/04/2015 11:27:56 AM This report has been signed electronically. Number of Addenda: 0

## 2015-11-04 NOTE — Anesthesia Postprocedure Evaluation (Signed)
Anesthesia Post Note  Patient: Paul Hicks  Procedure(s) Performed: Procedure(s) (LRB): COLONOSCOPY WITH PROPOFOL (N/A)  Patient location during evaluation: PACU Anesthesia Type: MAC Level of consciousness: awake and alert Pain management: pain level controlled Vital Signs Assessment: post-procedure vital signs reviewed and stable Respiratory status: spontaneous breathing, nonlabored ventilation, respiratory function stable and patient connected to nasal cannula oxygen Cardiovascular status: stable and blood pressure returned to baseline Anesthetic complications: no    Last Vitals:  Vitals:   11/04/15 1130 11/04/15 1140  BP: 138/73 131/77  Pulse: 84 78  Resp: 17 18  Temp:      Last Pain:  Vitals:   11/04/15 1112  TempSrc: Oral                 [name]

## 2015-11-04 NOTE — Anesthesia Preprocedure Evaluation (Signed)
Anesthesia Evaluation  Patient identified by MRN, date of birth, ID band Patient awake    Reviewed: Allergy & Precautions, NPO status , Patient's Chart, lab work & pertinent test results  History of Anesthesia Complications Negative for: history of anesthetic complications  Airway Mallampati: II  TM Distance: >3 FB Neck ROM: Full    Dental no notable dental hx. (+) Dental Advisory Given   Pulmonary former smoker,    Pulmonary exam normal breath sounds clear to auscultation       Cardiovascular hypertension, Pt. on medications Normal cardiovascular exam Rhythm:Regular Rate:Normal     Neuro/Psych PSYCHIATRIC DISORDERS Anxiety negative neurological ROS     GI/Hepatic Neg liver ROS, GERD  Medicated and Controlled,  Endo/Other  Morbid obesity  Renal/GU negative Renal ROS  negative genitourinary   Musculoskeletal negative musculoskeletal ROS (+)   Abdominal   Peds negative pediatric ROS (+)  Hematology negative hematology ROS (+)   Anesthesia Other Findings   Reproductive/Obstetrics negative OB ROS                             Anesthesia Physical  Anesthesia Plan  ASA: III  Anesthesia Plan: MAC   Post-op Pain Management:    Induction: Intravenous  Airway Management Planned: Nasal Cannula and Natural Airway  Additional Equipment:   Intra-op Plan:   Post-operative Plan:   Informed Consent: I have reviewed the patients History and Physical, chart, labs and discussed the procedure including the risks, benefits and alternatives for the proposed anesthesia with the patient or authorized representative who has indicated his/her understanding and acceptance.   Dental advisory given  Plan Discussed with: CRNA  Anesthesia Plan Comments:         Anesthesia Quick Evaluation

## 2015-11-05 ENCOUNTER — Encounter (HOSPITAL_COMMUNITY): Payer: Self-pay | Admitting: Internal Medicine

## 2015-11-07 ENCOUNTER — Encounter: Payer: Self-pay | Admitting: Internal Medicine

## 2015-11-07 DIAGNOSIS — Z86010 Personal history of colonic polyps: Secondary | ICD-10-CM

## 2015-11-07 NOTE — Progress Notes (Signed)
Diminutive adenoma - no residual cecal polyp Recall colon 10/2018 Letter created

## 2016-04-23 ENCOUNTER — Ambulatory Visit (INDEPENDENT_AMBULATORY_CARE_PROVIDER_SITE_OTHER): Payer: Medicare HMO

## 2016-04-23 ENCOUNTER — Ambulatory Visit (INDEPENDENT_AMBULATORY_CARE_PROVIDER_SITE_OTHER): Payer: Medicare HMO | Admitting: Orthopedic Surgery

## 2016-04-23 ENCOUNTER — Encounter (INDEPENDENT_AMBULATORY_CARE_PROVIDER_SITE_OTHER): Payer: Self-pay | Admitting: Orthopedic Surgery

## 2016-04-23 VITALS — Ht 72.0 in | Wt 300.0 lb

## 2016-04-23 DIAGNOSIS — M25512 Pain in left shoulder: Secondary | ICD-10-CM

## 2016-04-23 MED ORDER — LIDOCAINE HCL 1 % IJ SOLN
5.0000 mL | INTRAMUSCULAR | Status: AC | PRN
  Administered 2016-04-23: 5 mL

## 2016-04-23 MED ORDER — METHYLPREDNISOLONE ACETATE 40 MG/ML IJ SUSP
40.0000 mg | INTRAMUSCULAR | Status: AC | PRN
  Administered 2016-04-23: 40 mg via INTRA_ARTICULAR

## 2016-04-23 NOTE — Progress Notes (Signed)
Office Visit Note   Patient: Paul Hicks           Date of Birth: [birth year]           MRN: [id number] Visit Date: 04/23/2016              Requested by: Chesley Noon, MD [address] [place], [place] [address] PCP: Chesley Noon, MD  Chief Complaint  Patient presents with  . Left Shoulder - Pain    HPI: Patient is a 66 y.o male who presents today with right shoulder pain ongoing past two weeks. Patient was trying to dry his back off with a towel and externally rotated left shoulder having acute sharp pain which subsided after approximately 5 minutes. Two days later patient was trying to pick up a coffee pot and again had sharp pain. He states now his shoulder hurts all the time. He saw his medical doctor and was put on prednisone taper, which he had no relief. He complains of pain with driving, decreased range of motion, unable to extend arm backward. He has prior history of left shoulder arthroscopy 2002. Maxcine Ham, RT    Assessment & Plan: Visit Diagnoses:  1. Acute pain of left shoulder     Plan: Left shoulder was injected in subacromial space without complications. Patient was given instructions for internal/external rotation strengthening and scapular stabilization strengthening. Follow-up as needed. Discussed the patient may need a repeat arthroscopic debridement of the left shoulder. .  Follow-Up Instructions: Return if symptoms worsen or fail to improve.   Ortho Exam Examination patient is alert oriented no adenopathy well-dressed normal affect respiratory effort he has a normal gait. Examination his abduction and flexion to 120 the left shoulder he has pain with Neer and Hawkins impingement test pain with drop arm test no adhesive capsulitis  ROS: Complete review of systems negative except as mentioned in the history of present illness Imaging: Xr Shoulder Left  Result Date: 04/23/2016 Three-view radiographs of the left shoulder shows no  abnormalities in the lung field there is superior migration of the humeral head within the glenoid.   Labs: No results found for: HGBA1C, ESRSEDRATE, CRP, LABURIC, REPTSTATUS, GRAMSTAIN, CULT, LABORGA  Orders:  Orders Placed This Encounter  Procedures  . Large Joint Injection/Arthrocentesis  . XR Shoulder Left   No orders of the defined types were placed in this encounter.    Procedures: Large Joint Inj Date/Time: 04/23/2016 9:10 AM Performed by: Colleene Swarthout V Authorized by: Newt Minion   Consent Given by:  Patient Site marked: the procedure site was marked   Timeout: prior to procedure the correct patient, procedure, and site was verified   Indications:  Pain and diagnostic evaluation Location:  Shoulder Site:  L subacromial bursa Prep: patient was prepped and draped in usual sterile fashion   Needle Size:  22 G Needle Length:  1.5 inches Approach:  Posterior Ultrasound Guidance: No   Fluoroscopic Guidance: No   Arthrogram: No   Medications:  5 mL lidocaine 1 %; 40 mg methylPREDNISolone acetate 40 MG/ML Aspiration Attempted: No   Patient tolerance:  Patient tolerated the procedure well with no immediate complications    Clinical Data: No additional findings.  Subjective: Review of Systems  Objective: Vital Signs: Ht 6' (1.829 m)   Wt 300 lb (136.1 kg)   BMI 40.69 kg/m   Specialty Comments:  No specialty comments available.  PMFS History: Patient Active Problem List   Diagnosis Date Noted  .  Diverticulosis of colon without hemorrhage   . Family history of colon cancer - brother 02/11/2015  . History of adenomatous polyp of colon 07/30/2014   Past Medical History:  Diagnosis Date  . Anxiety   . GERD (gastroesophageal reflux disease)   . History of adenomatous polyp of colon 07/30/2014  . Hypertension   . Polyp of colon     Family History  Problem Relation Age of Onset  . Colon cancer Brother   . Esophageal cancer Neg Hx   . Rectal cancer Neg Hx    . Stomach cancer Neg Hx     Past Surgical History:  Procedure Laterality Date  . CATARACT EXTRACTION Bilateral   . COLONOSCOPY    . COLONOSCOPY N/A 02/11/2015   Procedure: COLONOSCOPY;  Surgeon: Gatha Mayer, MD;  Location: [facility];  Service: Endoscopy;  Laterality: N/A;  . COLONOSCOPY WITH PROPOFOL N/A 10/07/2014   Procedure: COLONOSCOPY WITH PROPOFOL;  Surgeon: Inda Castle, MD;  Location: [facility];  Service: Endoscopy;  Laterality: N/A;  . COLONOSCOPY WITH PROPOFOL N/A 11/04/2015   Procedure: COLONOSCOPY WITH PROPOFOL;  Surgeon: Gatha Mayer, MD;  Location: [facility];  Service: Endoscopy;  Laterality: N/A;  . HOT HEMOSTASIS N/A 10/07/2014   Procedure: HOT HEMOSTASIS (ARGON PLASMA COAGULATION/BICAP);  Surgeon: Inda Castle, MD;  Location: [facility];  Service: Endoscopy;  Laterality: N/A;  . HOT HEMOSTASIS N/A 02/11/2015   Procedure: HOT HEMOSTASIS (ARGON PLASMA COAGULATION/BICAP);  Surgeon: Gatha Mayer, MD;  Location: [facility];  Service: Endoscopy;  Laterality: N/A;  . right foot    . SHOULDER ARTHROSCOPY W/ ROTATOR CUFF REPAIR     both shoulders   Social History   Occupational History  . Not on file.   Social History Main Topics  . Smoking status: Former Smoker    Quit date: 10/19/1980  . Smokeless tobacco: Never Used  . Alcohol use 0.6 oz/week    1 Cans of beer per week     Comment: occasionally  . Drug use: No  . Sexual activity: Not on file

## 2016-05-13 ENCOUNTER — Telehealth (INDEPENDENT_AMBULATORY_CARE_PROVIDER_SITE_OTHER): Payer: Self-pay | Admitting: Orthopedic Surgery

## 2016-05-13 NOTE — Telephone Encounter (Signed)
Patient called having a few questions about his shoulder injections. CB # [phone]

## 2016-05-13 NOTE — Telephone Encounter (Signed)
Pt wants repeat injection and this was sch for 05/27/16 ok per erin.

## 2016-05-27 ENCOUNTER — Ambulatory Visit (INDEPENDENT_AMBULATORY_CARE_PROVIDER_SITE_OTHER): Payer: Medicare HMO

## 2016-05-27 ENCOUNTER — Ambulatory Visit (INDEPENDENT_AMBULATORY_CARE_PROVIDER_SITE_OTHER): Payer: Medicare HMO | Admitting: Orthopedic Surgery

## 2016-05-27 ENCOUNTER — Encounter (INDEPENDENT_AMBULATORY_CARE_PROVIDER_SITE_OTHER): Payer: Self-pay | Admitting: Orthopedic Surgery

## 2016-05-27 VITALS — Ht 72.0 in | Wt 300.0 lb

## 2016-05-27 DIAGNOSIS — M545 Low back pain: Secondary | ICD-10-CM

## 2016-05-27 DIAGNOSIS — G8929 Other chronic pain: Secondary | ICD-10-CM

## 2016-05-27 DIAGNOSIS — M25512 Pain in left shoulder: Secondary | ICD-10-CM | POA: Diagnosis not present

## 2016-05-27 MED ORDER — LIDOCAINE HCL 1 % IJ SOLN
5.0000 mL | INTRAMUSCULAR | Status: AC | PRN
  Administered 2016-05-27: 5 mL

## 2016-05-27 MED ORDER — METHYLPREDNISOLONE ACETATE 40 MG/ML IJ SUSP
40.0000 mg | INTRAMUSCULAR | Status: AC | PRN
  Administered 2016-05-27: 40 mg via INTRA_ARTICULAR

## 2016-05-27 NOTE — Progress Notes (Signed)
Office Visit Note   Patient: Paul Hicks           Date of Birth: [birth year]           MRN: [id number] Visit Date: 05/27/2016              Requested by: Chesley Noon, MD [place], [place] [address] PCP: Chesley Noon, MD  Chief Complaint  Patient presents with  . Left Shoulder - Pain  . Lower Back - Pain      HPI: Patient states that he had temporary relief with the injection for the left shoulder he states his pain went down to 2 out of 10 and is now about a 6 out of 10. Has pain with reaching behind himself and with elevation. Patient also complains of chronic lower back pain which improves with ambulation. Pain with start up.  Assessment & Plan: Visit Diagnoses:  1. Chronic left-sided low back pain without sciatica   2. Acute pain of left shoulder     Plan: Plan for subacromial injection of the left shoulder recommended exercise and weight loss for the lower back pain and also discussed the intake over the counter anti-inflammatories for the back pain.  Follow-Up Instructions: Return if symptoms worsen or fail to improve.   Ortho Exam  Patient is alert, oriented, no adenopathy, well-dressed, normal affect, normal respiratory effort. Patient has a normal gait. Examination of left shoulder he has pain with Neer and Hawkins impingement test pain with drop arm test of the left shoulder. He is tender to palpation of the biceps tendon. Examination of his lower extremities has a negative straight leg raise bilaterally no focal motor weakness in either lower extremity. Radiographs show advanced degenerative disc disease with osteophytic bone spurs anteriorly.  Imaging: Xr Lumbar Spine 2-3 Views  Result Date: 05/27/2016 Two-view radiographs of the lumbar spine shows advanced degenerative disc disease with disc space collapse throughout the lumbar spine with large osteophytic bone spurs L2-L5 with foraminal narrowing.   Labs: No results found for: HGBA1C,  ESRSEDRATE, CRP, LABURIC, REPTSTATUS, GRAMSTAIN, CULT, LABORGA  Orders:  Orders Placed This Encounter  Procedures  . XR Lumbar Spine 2-3 Views   No orders of the defined types were placed in this encounter.    Procedures: Large Joint Inj Date/Time: 05/27/2016 8:43 AM Performed by: Tay Whitwell V Authorized by: Newt Minion   Consent Given by:  Patient Site marked: the procedure site was marked   Timeout: prior to procedure the correct patient, procedure, and site was verified   Indications:  Pain and diagnostic evaluation Location:  Shoulder Site:  L subacromial bursa Prep: patient was prepped and draped in usual sterile fashion   Needle Size:  22 G Needle Length:  1.5 inches Approach:  Posterior Ultrasound Guidance: No   Fluoroscopic Guidance: No   Arthrogram: No   Medications:  5 mL lidocaine 1 %; 40 mg methylPREDNISolone acetate 40 MG/ML Aspiration Attempted: No   Patient tolerance:  Patient tolerated the procedure well with no immediate complications    Clinical Data: No additional findings.  ROS:  All other systems negative, except as noted in the HPI. Review of Systems  Objective: Vital Signs: Ht 6' (1.829 m)   Wt 300 lb (136.1 kg)   BMI 40.69 kg/m   Specialty Comments:  No specialty comments available.  PMFS History: Patient Active Problem List   Diagnosis Date Noted  . Diverticulosis of colon without hemorrhage   .  Family history of colon cancer - brother 02/11/2015  . History of adenomatous polyp of colon 07/30/2014   Past Medical History:  Diagnosis Date  . Anxiety   . GERD (gastroesophageal reflux disease)   . History of adenomatous polyp of colon 07/30/2014  . Hypertension   . Polyp of colon     Family History  Problem Relation Age of Onset  . Colon cancer Brother   . Esophageal cancer Neg Hx   . Rectal cancer Neg Hx   . Stomach cancer Neg Hx     Past Surgical History:  Procedure Laterality Date  . CATARACT EXTRACTION Bilateral     . COLONOSCOPY    . COLONOSCOPY N/A 02/11/2015   Procedure: COLONOSCOPY;  Surgeon: Gatha Mayer, MD;  Location: [facility];  Service: Endoscopy;  Laterality: N/A;  . COLONOSCOPY WITH PROPOFOL N/A 10/07/2014   Procedure: COLONOSCOPY WITH PROPOFOL;  Surgeon: Inda Castle, MD;  Location: [facility];  Service: Endoscopy;  Laterality: N/A;  . COLONOSCOPY WITH PROPOFOL N/A 11/04/2015   Procedure: COLONOSCOPY WITH PROPOFOL;  Surgeon: Gatha Mayer, MD;  Location: [facility];  Service: Endoscopy;  Laterality: N/A;  . HOT HEMOSTASIS N/A 10/07/2014   Procedure: HOT HEMOSTASIS (ARGON PLASMA COAGULATION/BICAP);  Surgeon: Inda Castle, MD;  Location: [facility];  Service: Endoscopy;  Laterality: N/A;  . HOT HEMOSTASIS N/A 02/11/2015   Procedure: HOT HEMOSTASIS (ARGON PLASMA COAGULATION/BICAP);  Surgeon: Gatha Mayer, MD;  Location: [facility];  Service: Endoscopy;  Laterality: N/A;  . right foot    . SHOULDER ARTHROSCOPY W/ ROTATOR CUFF REPAIR     both shoulders   Social History   Occupational History  . Not on file.   Social History Main Topics  . Smoking status: Former Smoker    Quit date: 10/19/1980  . Smokeless tobacco: Never Used  . Alcohol use 0.6 oz/week    1 Cans of beer per week     Comment: occasionally  . Drug use: No  . Sexual activity: Not on file

## 2018-09-21 ENCOUNTER — Encounter: Payer: Self-pay | Admitting: Internal Medicine

## 2018-09-28 ENCOUNTER — Telehealth: Payer: Self-pay

## 2018-09-28 NOTE — Telephone Encounter (Signed)
Dr Carlean Purl, This pt is scheduled for his colon on 09/01/202. While reviewing his chart , I saw the pt has been scheduled at [facility] in 2016 and 2017 due to large sessile adenomas.  Is the pt ok for his colon at [facility] or does he need a hospital appointment?  Please advise. Thanks, Gwyndolyn Saxon in [facility]

## 2018-09-29 NOTE — Telephone Encounter (Signed)
OK for [place]

## 2018-10-04 NOTE — Telephone Encounter (Signed)
Per Dr Carlean Purl, pt is ok for procedure at [facility]. Will proceed as scheduled. Gwyndolyn Saxon

## 2018-10-10 ENCOUNTER — Other Ambulatory Visit: Payer: Self-pay

## 2018-10-10 ENCOUNTER — Ambulatory Visit (AMBULATORY_SURGERY_CENTER): Payer: Self-pay | Admitting: *Deleted

## 2018-10-10 VITALS — Temp 97.5°F | Ht 72.0 in | Wt 283.0 lb

## 2018-10-10 DIAGNOSIS — Z860100 Personal history of colon polyps, unspecified: Secondary | ICD-10-CM

## 2018-10-10 DIAGNOSIS — Z800 Family history of malignant neoplasm of digestive organs: Secondary | ICD-10-CM

## 2018-10-10 NOTE — Progress Notes (Signed)
No egg or soy allergy known to patient  No issues with past sedation with any surgeries  or procedures, no intubation problems  No diet pills per patient No home 02 use per patient  No blood thinners per patient  Pt denies issues with constipation  No A fib or A flutter  EMMI video declined    

## 2018-10-18 ENCOUNTER — Encounter: Payer: Self-pay | Admitting: Internal Medicine

## 2018-10-23 ENCOUNTER — Telehealth: Payer: Self-pay

## 2018-10-23 NOTE — Telephone Encounter (Signed)
Covid-19 screening questions   Do you now or have you had a fever in the last 14 days? NO   Do you have any respiratory symptoms of shortness of breath or cough now or in the last 14 days? NO  Do you have any family members or close contacts with diagnosed or suspected Covid-19 in the past 14 days? NO  Have you been tested for Covid-19 and found to be positive? NO        

## 2018-10-24 ENCOUNTER — Ambulatory Visit (AMBULATORY_SURGERY_CENTER): Payer: Medicare HMO | Admitting: Internal Medicine

## 2018-10-24 ENCOUNTER — Other Ambulatory Visit: Payer: Self-pay

## 2018-10-24 ENCOUNTER — Encounter: Payer: Self-pay | Admitting: Internal Medicine

## 2018-10-24 VITALS — BP 159/96 | HR 82 | Temp 97.9°F | Resp 18 | Ht 72.0 in | Wt 283.0 lb

## 2018-10-24 DIAGNOSIS — Z86010 Personal history of colonic polyps: Secondary | ICD-10-CM

## 2018-10-24 DIAGNOSIS — D123 Benign neoplasm of transverse colon: Secondary | ICD-10-CM

## 2018-10-24 MED ORDER — SODIUM CHLORIDE 0.9 % IV SOLN: 500.0000 mL | INTRAVENOUS | Status: DC

## 2018-10-24 NOTE — Op Note (Signed)
[place] Patient Name: Paul Hicks Procedure Date: 10/24/2018 7:23 AM MRN: [id number] Endoscopist: Gatha Mayer , MD Age: 68 Referring MD:  Date of Birth: [birth year] Gender: Male Account #: [id number] Procedure:                Colonoscopy Indications:              Surveillance: Personal history of adenomatous                            polyps on last colonoscopy 3 years ago Medicines:                Propofol per Anesthesia, Monitored Anesthesia Care Procedure:                Pre-Anesthesia Assessment:                           - Prior to the procedure, a History and Physical                            was performed, and patient medications and                            allergies were reviewed. The patient's tolerance of                            previous anesthesia was also reviewed. The risks                            and benefits of the procedure and the sedation                            options and risks were discussed with the patient.                            All questions were answered, and informed consent                            was obtained. Prior Anticoagulants: The patient has                            taken no previous anticoagulant or antiplatelet                            agents. ASA Grade Assessment: III - A patient with                            severe systemic disease. After reviewing the risks                            and benefits, the patient was deemed in                            satisfactory condition to undergo the procedure.  After obtaining informed consent, the colonoscope                            was passed under direct vision. Throughout the                            procedure, the patient's blood pressure, pulse, and                            oxygen saturations were monitored continuously. The                            Colonoscope was introduced through the anus and   advanced to the the terminal ileum, with                            identification of the appendiceal orifice and IC                            valve. The colonoscopy was performed without                            difficulty. The patient tolerated the procedure                            well. The quality of the bowel preparation was                            good. The bowel preparation used was Miralax via                            split dose instruction. The terminal ileum,                            ileocecal valve, appendiceal orifice, and rectum                            were photographed. Scope In: 8:03:52 AM Scope Out: 8:19:53 AM Scope Withdrawal Time: 0 hours 13 minutes 45 seconds  Total Procedure Duration: 0 hours 16 minutes 1 second  Findings:                 The perianal and digital rectal examinations were                            normal. Pertinent negatives include normal prostate                            (size, shape, and consistency).                           The terminal ileum appeared normal.                           Two sessile polyps were found in the transverse  colon. The polyps were diminutive in size. These                            polyps were removed with a cold snare. Resection                            and retrieval were complete. Verification of                            patient identification for the specimen was done.                            Estimated blood loss was minimal.                           Many small and large-mouthed diverticula were found                            in the sigmoid colon. There was narrowing of the                            colon in association with the diverticular opening.                           The exam was otherwise without abnormality on                            direct and retroflexion views. Complications:            No immediate complications. Estimated Blood Loss:      Estimated blood loss was minimal. Impression:               - The examined portion of the ileum was normal.                           - Two diminutive polyps in the transverse colon,                            removed with a cold snare. Resected and retrieved.                           - Severe diverticulosis in the sigmoid colon. There                            was narrowing of the colon in association with the                            diverticular opening.                           - The examination was otherwise normal on direct                            and retroflexion views.                           -  Personal history of colonic polyps. 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 The signs and symptoms of potential                            delayed complications were discussed with the                            patient. Return to normal activities tomorrow.                            Written discharge instructions were provided to the                            patient.                           - Resume previous diet.                           - Continue present medications.                           - Repeat colonoscopy is recommended for                            surveillance. The colonoscopy date will be                            determined after pathology results from today's                            exam become available for review. Gatha Mayer, MD 10/24/2018 8:28:24 AM This report has been signed electronically.

## 2018-10-24 NOTE — Progress Notes (Signed)
To PACU, VSS. Report to [email or web address] 

## 2018-10-24 NOTE — Progress Notes (Signed)
Called to room to assist during endoscopic procedure.  Patient ID and intended procedure confirmed with present staff. Received instructions for my participation in the procedure from the performing physician.  

## 2018-10-24 NOTE — Patient Instructions (Addendum)
Two tiny polyps removed and the diverticulosis.  This is good news!  I will let you know pathology results and when to have another routine colonoscopy by mail and/or My Chart.  I appreciate the opportunity to care for you. Gatha Mayer, MD, [facility]   Information on polyps and diverticulosis given to you today.  Await pathology results.  YOU HAD AN ENDOSCOPIC PROCEDURE TODAY AT [facility]:   Refer to the procedure report that was given to you for any specific questions about what was found during the examination.  If the procedure report does not answer your questions, please call your gastroenterologist to clarify.  If you requested that your care partner not be given the details of your procedure findings, then the procedure report has been included in a sealed envelope for you to review at your convenience later.  YOU SHOULD EXPECT: Some feelings of bloating in the abdomen. Passage of more gas than usual.  Walking can help get rid of the air that was put into your GI tract during the procedure and reduce the bloating. If you had a lower endoscopy (such as a colonoscopy or flexible sigmoidoscopy) you may notice spotting of blood in your stool or on the toilet paper. If you underwent a bowel prep for your procedure, you may not have a normal bowel movement for a few days.  Please Note:  You might notice some irritation and congestion in your nose or some drainage.  This is from the oxygen used during your procedure.  There is no need for concern and it should clear up in a day or so.  SYMPTOMS TO REPORT IMMEDIATELY:   Following lower endoscopy (colonoscopy or flexible sigmoidoscopy):  Excessive amounts of blood in the stool  Significant tenderness or worsening of abdominal pains  Swelling of the abdomen that is new, acute  Fever of 100F or higher   For urgent or emergent issues, a gastroenterologist can be reached at any hour by calling [phone].   DIET:  We  do recommend a small meal at first, but then you may proceed to your regular diet.  Drink plenty of fluids but you should avoid alcoholic beverages for 24 hours.  ACTIVITY:  You should plan to take it easy for the rest of today and you should NOT DRIVE or use heavy machinery until tomorrow (because of the sedation medicines used during the test).    FOLLOW UP: Our staff will call the number listed on your records 48-72 hours following your procedure to check on you and address any questions or concerns that you may have regarding the information given to you following your procedure. If we do not reach you, we will leave a message.  We will attempt to reach you two times.  During this call, we will ask if you have developed any symptoms of COVID 19. If you develop any symptoms (ie: fever, flu-like symptoms, shortness of breath, cough etc.) before then, please call [phone].  If you test positive for Covid 19 in the 2 weeks post procedure, please call and report this information to [place].    If any biopsies were taken you will be contacted by phone or by letter within the next 1-3 weeks.  Please call us at [phone] if you have not heard about the biopsies in 3 weeks.    SIGNATURES/CONFIDENTIALITY: You and/or your care partner have signed paperwork which will be entered into your electronic medical record.  These signatures attest  to the fact that that the information above on your After Visit Summary has been reviewed and is understood.  Full responsibility of the confidentiality of this discharge information lies with you and/or your care-partner. 

## 2018-10-26 ENCOUNTER — Telehealth: Payer: Self-pay | Admitting: *Deleted

## 2018-10-26 NOTE — Telephone Encounter (Signed)
1. Have you developed a fever since your procedure? no  2.   Have you had an respiratory symptoms (SOB or cough) since your procedure? no  3.   Have you tested positive for COVID 19 since your procedure no  4.   Have you had any family members/close contacts diagnosed with the COVID 19 since your procedure?  no   If yes to any of these questions please route to Joylene John, RN and Alphonsa Gin, Therapist, sports.  Follow up Call-  Call back number 10/24/2018  Post procedure Call Back phone  # [phone]  Permission to leave phone message Yes  Some recent data might be hidden     Patient questions:  Do you have a fever, pain , or abdominal swelling? No. Pain Score  0 *  Have you tolerated food without any problems? Yes.    Have you been able to return to your normal activities? Yes.    Do you have any questions about your discharge instructions: Diet   No. Medications  No. Follow up visit  No.  Do you have questions or concerns about your Care? No.  Actions: * If pain score is 4 or above: No action needed, pain <4.

## 2018-10-31 ENCOUNTER — Encounter: Payer: Self-pay | Admitting: Internal Medicine

## 2018-10-31 NOTE — Progress Notes (Signed)
2 diminutive adenomas Recall 2025

## 2019-10-24 NOTE — Progress Notes (Signed)
Referring-Sheri Loyal Buba PA-C Reason for referral-CAD  HPI: 69 year old male for evaluation of coronary artery disease at request of Nicholes Rough, PA-C.  History of diabetes mellitus, hypertension and hyperlipidemia.  Abdominal ultrasound January 2019 showed no aneurysm.  Patient had calcium score performed July 2021; total 454.  Patient does have dyspnea on exertion with vigorous activities.  No orthopnea, PND or pedal edema.  No palpitations or syncope.  He has some discomfort in his left shoulder with activities but he has had orthopedic problems previously with that shoulder.  Occasional discomfort in his upper chest with vigorous activities.  Current Outpatient Medications  Medication Sig Dispense Refill   Blood Glucose Monitoring Suppl (ONE TOUCH ULTRA 2) w/Device KIT USE AS DIRECTED     cloNIDine (CATAPRES) 0.1 MG tablet Take 1 tablet by mouth 2 (two) times daily.     cyanocobalamin ([employer] VITAMIN B12) 1000 MCG tablet Take by mouth.     diltiazem (CARDIZEM CD) 300 MG 24 hr capsule TAKE ONE CAPSULE (300 MG TOTAL) BY MOUTH DAILY.     FLUoxetine (PROZAC) 20 MG capsule Take 20 mg by mouth daily.     glucose blood test strip USE AS DIRECTED ONCE DAILY     metFORMIN (GLUCOPHAGE-XR) 750 MG 24 hr tablet TAKE ONE TABLET (750 MG DOSE) BY MOUTH WITH BREAKFAST.     Omega-3 Fatty Acids (FISH OIL) 1000 MG CAPS Take 2,000 mg by mouth daily.      omeprazole (PRILOSEC) 40 MG capsule TAKE ONE CAPSULE (40 MG DOSE) BY MOUTH DAILY.     OneTouch Delica Lancets 94W MISC TEST DAILY     ranitidine (ZANTAC) 300 MG tablet Take 300 mg by mouth daily.      valsartan-hydrochlorothiazide (DIOVAN-HCT) 320-25 MG tablet Take 1 tablet by mouth daily.     No current facility-administered medications for this visit.    No Known Allergies   Past Medical History:  Diagnosis Date   Allergy    Pollen - no meds    Anxiety    Arthritis    left shoulder, neck, back    Cataract    removed both eyes     Diabetes mellitus without complication (HCC)    On Metformin    GERD (gastroesophageal reflux disease)    History of adenomatous polyp of colon 07/30/2014   Hypertension    Polyp of colon     Past Surgical History:  Procedure Laterality Date   CATARACT EXTRACTION Bilateral    COLONOSCOPY     COLONOSCOPY N/A 02/11/2015   Procedure: COLONOSCOPY;  Surgeon: Gatha Mayer, MD;  Location: [facility];  Service: Endoscopy;  Laterality: N/A;   COLONOSCOPY WITH PROPOFOL N/A 10/07/2014   Procedure: COLONOSCOPY WITH PROPOFOL;  Surgeon: Inda Castle, MD;  Location: [facility];  Service: Endoscopy;  Laterality: N/A;   COLONOSCOPY WITH PROPOFOL N/A 11/04/2015   Procedure: COLONOSCOPY WITH PROPOFOL;  Surgeon: Gatha Mayer, MD;  Location: [facility];  Service: Endoscopy;  Laterality: N/A;   HOT HEMOSTASIS N/A 10/07/2014   Procedure: HOT HEMOSTASIS (ARGON PLASMA COAGULATION/BICAP);  Surgeon: Inda Castle, MD;  Location: [facility];  Service: Endoscopy;  Laterality: N/A;   HOT HEMOSTASIS N/A 02/11/2015   Procedure: HOT HEMOSTASIS (ARGON PLASMA COAGULATION/BICAP);  Surgeon: Gatha Mayer, MD;  Location: [facility];  Service: Endoscopy;  Laterality: N/A;   POLYPECTOMY     right foot     SHOULDER ARTHROSCOPY W/ ROTATOR CUFF REPAIR     both shoulders  Social History   Socioeconomic History   Marital status: Married    Spouse name: Not on file   Number of children: 2   Years of education: Not on file   Highest education level: Not on file  Occupational History   Not on file  Tobacco Use   Smoking status: Former Smoker    Quit date: 10/19/1980    Years since quitting: 39.0   Smokeless tobacco: Never Used  Substance and Sexual Activity   Alcohol use: Yes    Alcohol/week: 1.0 standard drink    Types: 1 Cans of beer per week    Comment: occasionally   Drug use: No   Sexual activity: Not on file  Other Topics Concern   Not on file  Social History  Narrative   Not on file   Social Determinants of Health   Financial Resource Strain:    Difficulty of Paying Living Expenses: Not on file  Food Insecurity:    Worried About [place] in the Last Year: Not on file   Ran Out of Food in the Last Year: Not on file  Transportation Needs:    Lack of Transportation (Medical): Not on file   Lack of Transportation (Non-Medical): Not on file  Physical Activity:    Days of Exercise per Week: Not on file   Minutes of Exercise per Session: Not on file  Stress:    Feeling of Stress : Not on file  Social Connections:    Frequency of Communication with Friends and Family: Not on file   Frequency of Social Gatherings with Friends and Family: Not on file   Attends Religious Services: Not on file   Active Member of Clubs or Organizations: Not on file   Attends Archivist Meetings: Not on file   Marital Status: Not on file  Intimate Partner Violence:    Fear of Current or Ex-Partner: Not on file   Emotionally Abused: Not on file   Physically Abused: Not on file   Sexually Abused: Not on file    Family History  Problem Relation Age of Onset   Colon cancer Brother    Colon polyps Brother    Esophageal cancer Neg Hx    Rectal cancer Neg Hx    Stomach cancer Neg Hx     ROS: Back and knee pain but no fevers or chills, productive cough, hemoptysis, dysphasia, odynophagia, melena, hematochezia, dysuria, hematuria, rash, seizure activity, orthopnea, PND, pedal edema, claudication. Remaining systems are negative.  Physical Exam:   Blood pressure 138/80, pulse 87, height 6' (1.829 m), weight 285 lb 6.4 oz (129.5 kg), SpO2 97 %.  General:  Well developed/obese in NAD Skin warm/dry Patient not depressed No peripheral clubbing Back-normal HEENT-normal/normal eyelids Neck supple/normal carotid upstroke bilaterally; no bruits; no JVD; no thyromegaly chest - CTA/ normal expansion CV - RRR/normal S1 and  S2; no murmurs, rubs or gallops;  PMI nondisplaced Abdomen -NT/ND, no HSM, no mass, + bowel sounds, no bruit 2+ femoral pulses, no bruits Ext-no edema, chords, 2+ DP Neuro-grossly nonfocal  ECG -sinus rhythm with PACs, low voltage. Personally reviewed  A/P  1 coronary artery disease-based on recent calcium score that was elevated.  Plan to treat with aspirin 81 mg daily and statin.  He has multiple risk factors including diabetes mellitus, hypertension and family history of coronary disease.  He has some dyspnea on exertion and occasional vague chest pain.  We will plan cardiac CTA to exclude significant stenosis.  2 hypertension-patient's blood pressure is mildly elevated.  I have asked him to track this and we will increase regimen as needed.  3 hyperlipidemia-given documented coronary disease we will increase Crestor from 5 to 40 mg daily.  Check lipids and liver in 12 weeks.  4 diabetes mellitus-Per primary care.  Kirk Ruths, MD

## 2019-10-25 ENCOUNTER — Encounter: Payer: Self-pay | Admitting: Cardiology

## 2019-10-25 ENCOUNTER — Other Ambulatory Visit: Payer: Self-pay

## 2019-10-25 ENCOUNTER — Ambulatory Visit: Payer: Medicare HMO | Admitting: Cardiology

## 2019-10-25 VITALS — BP 138/80 | HR 87 | Ht 72.0 in | Wt 285.4 lb

## 2019-10-25 DIAGNOSIS — I2510 Atherosclerotic heart disease of native coronary artery without angina pectoris: Secondary | ICD-10-CM

## 2019-10-25 DIAGNOSIS — I10 Essential (primary) hypertension: Secondary | ICD-10-CM | POA: Diagnosis not present

## 2019-10-25 DIAGNOSIS — R072 Precordial pain: Secondary | ICD-10-CM

## 2019-10-25 DIAGNOSIS — R079 Chest pain, unspecified: Secondary | ICD-10-CM

## 2019-10-25 DIAGNOSIS — E7800 Pure hypercholesterolemia, unspecified: Secondary | ICD-10-CM

## 2019-10-25 MED ORDER — ASPIRIN EC 81 MG PO TBEC: 81.0000 mg | DELAYED_RELEASE_TABLET | Freq: Every day | ORAL | 3 refills | Status: AC

## 2019-10-25 MED ORDER — ROSUVASTATIN CALCIUM 40 MG PO TABS: 40.0000 mg | ORAL_TABLET | Freq: Every day | ORAL | 3 refills | Status: DC

## 2019-10-25 MED ORDER — METOPROLOL TARTRATE 100 MG PO TABS: ORAL_TABLET | ORAL | 0 refills | Status: DC

## 2019-10-25 NOTE — Patient Instructions (Addendum)
Medication Instructions:   START ASPIRIN 81 MG ONCE DAILY  INCREASE ROSUVASTATIN TO 40 MG ONCE DAILY  *If you need a refill on your cardiac medications before your next appointment, please call your pharmacy*   Lab Work:  Your physician recommends that you return for lab work in: [place]  If you have labs (blood work) drawn today and your tests are completely normal, you will receive your results only by: Marland Kitchen MyChart Message (if you have MyChart) OR . A paper copy in the mail If you have any lab test that is abnormal or we need to change your treatment, we will call you to review the results.   Testing/Procedures:  Your cardiac CT will be scheduled at one of the below locations:   [facility] [address] [place], [place] [address] [phone]  If scheduled at [facility], please arrive at the [facility] main entrance of [facility] 30 minutes prior to test start time. Proceed to the [facility] Radiology Department (first floor) to check-in and test prep.  Please follow these instructions carefully (unless otherwise directed):  Hold all erectile dysfunction medications at least 3 days (72 hrs) prior to test.  On the Night Before the Test: . Be sure to Drink plenty of water. . Do not consume any caffeinated/decaffeinated beverages or chocolate 12 hours prior to your test. . Do not take any antihistamines 12 hours prior to your test.  On the Day of the Test: . Drink plenty of water. Do not drink any water within one hour of the test. . Do not eat any food 4 hours prior to the test. . You may take your regular medications prior to the test.  . Take metoprolol (Lopressor) 100 MG two hours prior to test. . HOLD Furosemide/Hydrochlorothiazide morning of the test       After the Test: . Drink plenty of water. . After receiving IV contrast, you may experience a mild flushed feeling. This is normal. . On occasion, you may experience a  mild rash up to 24 hours after the test. This is not dangerous. If this occurs, you can take Benadryl 25 mg and increase your fluid intake. . If you experience trouble breathing, this can be serious. If it is severe call 911 IMMEDIATELY. If it is mild, please call our office. . If you take any of these medications: Glipizide/Metformin, Avandament, Glucavance, please do not take 48 hours after completing test unless otherwise instructed.   Once we have confirmed authorization from your insurance company, we will call you to set up a date and time for your test. Based on how quickly your insurance processes prior authorizations requests, please allow up to 4 weeks to be contacted for scheduling your Cardiac CT appointment. Be advised that routine Cardiac CT appointments could be scheduled as many as 8 weeks after your provider has ordered it.  For non-scheduling related questions, please contact the cardiac imaging nurse navigator should you have any questions/concerns: Marchia Bond, Cardiac Imaging Nurse Navigator Burley Saver, Interim Cardiac Imaging Nurse [place] [facility] Direct Office Dial: [phone]   For scheduling needs, including cancellations and rescheduling, please call Vivien Rota at [phone], option 3.    Follow-Up: At [facility], you and your health needs are our priority.  As part of our continuing mission to provide you with exceptional heart care, we have created designated Provider Care Teams.  These Care Teams include your primary Cardiologist (physician)  and Advanced Practice Providers (APPs -  Physician Assistants and Nurse Practitioners) who all work together to provide you with the care you need, when you need it.  We recommend signing up for the patient portal called "MyChart".  Sign up information is provided on this After Visit Summary.  MyChart is used to connect with patients for Virtual Visits (Telemedicine).  Patients are able to view  lab/test results, encounter notes, upcoming appointments, etc.  Non-urgent messages can be sent to your provider as well.   To learn more about what you can do with MyChart, go to [email or web address].    Your next appointment:   6 month(s)  The format for your next appointment:   In Person  Provider:   You may see Kirk Ruths MD or one of the following Advanced Practice Providers on your designated Care Team:    Kerin Ransom, PA-C  [place], [place]  Coletta Memos, [place]    Other Instructions  TRACK BLOOD PRESSURE GOAL= >130/85

## 2019-11-02 ENCOUNTER — Telehealth: Payer: Self-pay | Admitting: Cardiology

## 2019-11-02 NOTE — Telephone Encounter (Signed)
     Paul Hicks [employer] called, she said the CT [facility] request has been approved and it is good for 11/02/2019 to 05/01/2019 with auth ID [id number]. She said she will fax approval letter as well.

## 2019-11-02 NOTE — Telephone Encounter (Signed)
Noted and will send to precert department

## 2019-11-07 ENCOUNTER — Telehealth: Payer: Self-pay | Admitting: *Deleted

## 2019-11-07 DIAGNOSIS — I10 Essential (primary) hypertension: Secondary | ICD-10-CM

## 2019-11-07 DIAGNOSIS — Z01812 Encounter for preprocedural laboratory examination: Secondary | ICD-10-CM

## 2019-11-07 NOTE — Telephone Encounter (Signed)
-----   Message from Roosvelt Maser sent at 11/06/2019  4:18 PM EDT ----- Regarding: ct heart   Patient scheduled 9/22 @ 7:45am.  He will need labs this week, orders need to be placed in epic  Thanks, Vivien Rota

## 2019-11-07 NOTE — Telephone Encounter (Signed)
Advised patient and he will get BMET this week

## 2019-11-08 LAB — HEPATIC FUNCTION PANEL
ALT: 17 IU/L (ref 0–44)
AST: 18 IU/L (ref 0–40)
Albumin: 4.5 g/dL (ref 3.7–4.7)
Alkaline Phosphatase: 65 IU/L (ref 44–121)
Bilirubin Total: 0.3 mg/dL (ref 0.0–1.2)
Bilirubin, Direct: 0.11 mg/dL (ref 0.00–0.40)
Total Protein: 7.5 g/dL (ref 6.0–8.5)

## 2019-11-08 LAB — BASIC METABOLIC PANEL
BUN/Creatinine Ratio: 18 (ref 10–24)
BUN: 16 mg/dL (ref 8–27)
CO2: 25 mmol/L (ref 20–29)
Calcium: 9.5 mg/dL (ref 8.6–10.2)
Chloride: 94 mmol/L — ABNORMAL LOW (ref 96–106)
Creatinine, Ser: 0.88 mg/dL (ref 0.76–1.27)
GFR calc Af Amer: 100 mL/min/{1.73_m2} (ref >59)
GFR calc non Af Amer: 86 mL/min/{1.73_m2} (ref >59)
Glucose: 171 mg/dL — ABNORMAL HIGH (ref 65–99)
Potassium: 4.2 mmol/L (ref 3.5–5.2)
Sodium: 136 mmol/L (ref 134–144)

## 2019-11-08 LAB — LIPID PANEL
Chol/HDL Ratio: 2.5 ratio (ref 0.0–5.0)
Cholesterol, Total: 68 mg/dL — ABNORMAL LOW (ref 100–199)
HDL: 27 mg/dL — ABNORMAL LOW (ref >39)
LDL Chol Calc (NIH): 17 mg/dL (ref 0–99)
Triglycerides: 134 mg/dL (ref 0–149)
VLDL Cholesterol Cal: 24 mg/dL (ref 5–40)

## 2019-11-12 ENCOUNTER — Telehealth (HOSPITAL_COMMUNITY): Payer: Self-pay | Admitting: *Deleted

## 2019-11-12 NOTE — Telephone Encounter (Signed)
Attempted to call patient regarding upcoming cardiac CT appointment. °Left message on voicemail with name and callback number ° °Drexel Ivey Tai RN Navigator Cardiac Imaging °[facility] °[phone] Office °[phone] Cell ° °

## 2019-11-12 NOTE — Telephone Encounter (Signed)
Pt returning call regarding upcoming cardiac imaging study; pt verbalizes understanding of appt date/time, parking situation and where to check in, pre-test NPO status and medications ordered, and verified current allergies; name and call back number provided for further questions should they arise  Paul Hicks Tai RN Navigator Cardiac Imaging [facility] [phone] office [phone] cell  

## 2019-11-14 ENCOUNTER — Ambulatory Visit (HOSPITAL_COMMUNITY)
Admission: RE | Admit: 2019-11-14 | Discharge: 2019-11-14 | Disposition: A | Discharge status: Home / Self Care | Payer: Medicare HMO | Source: Ambulatory Visit | Attending: Cardiology | Admitting: Cardiology

## 2019-11-14 ENCOUNTER — Other Ambulatory Visit: Payer: Self-pay

## 2019-11-14 DIAGNOSIS — I6522 Occlusion and stenosis of left carotid artery: Secondary | ICD-10-CM | POA: Insufficient documentation

## 2019-11-14 DIAGNOSIS — R072 Precordial pain: Secondary | ICD-10-CM

## 2019-11-14 DIAGNOSIS — I2510 Atherosclerotic heart disease of native coronary artery without angina pectoris: Secondary | ICD-10-CM | POA: Diagnosis not present

## 2019-11-14 MED ORDER — NITROGLYCERIN 0.4 MG SL SUBL
0.8000 mg | SUBLINGUAL_TABLET | Freq: Once | SUBLINGUAL | Status: AC
  Administered 2019-11-14: 0.8 mg via SUBLINGUAL

## 2019-11-14 MED ORDER — NITROGLYCERIN 0.4 MG SL SUBL
SUBLINGUAL_TABLET | SUBLINGUAL | Status: AC
  Filled 2019-11-14: qty 2

## 2019-11-14 MED ORDER — IOHEXOL 350 MG/ML SOLN
80.0000 mL | Freq: Once | INTRAVENOUS | Status: AC | PRN
  Administered 2019-11-14: 80 mL via INTRAVENOUS

## 2019-11-15 DIAGNOSIS — R931 Abnormal findings on diagnostic imaging of heart and coronary circulation: Secondary | ICD-10-CM

## 2019-11-15 DIAGNOSIS — R072 Precordial pain: Secondary | ICD-10-CM

## 2019-11-15 DIAGNOSIS — Z6838 Body mass index (BMI) 38.0-38.9, adult: Secondary | ICD-10-CM

## 2019-11-19 ENCOUNTER — Other Ambulatory Visit: Payer: Self-pay

## 2019-11-19 ENCOUNTER — Encounter: Payer: Self-pay | Admitting: Cardiology

## 2019-11-19 ENCOUNTER — Ambulatory Visit (INDEPENDENT_AMBULATORY_CARE_PROVIDER_SITE_OTHER): Payer: Medicare HMO | Admitting: Cardiology

## 2019-11-19 VITALS — BP 146/70 | HR 76 | Ht 72.0 in | Wt 280.6 lb

## 2019-11-19 DIAGNOSIS — I10 Essential (primary) hypertension: Secondary | ICD-10-CM | POA: Diagnosis not present

## 2019-11-19 DIAGNOSIS — E7800 Pure hypercholesterolemia, unspecified: Secondary | ICD-10-CM

## 2019-11-19 DIAGNOSIS — I2510 Atherosclerotic heart disease of native coronary artery without angina pectoris: Secondary | ICD-10-CM

## 2019-11-19 NOTE — Patient Instructions (Signed)
  Follow-Up: At [facility], you and your health needs are our priority.  As part of our continuing mission to provide you with exceptional heart care, we have created designated Provider Care Teams.  These Care Teams include your primary Cardiologist (physician) and Advanced Practice Providers (APPs -  Physician Assistants and Nurse Practitioners) who all work together to provide you with the care you need, when you need it.  We recommend signing up for the patient portal called "MyChart".  Sign up information is provided on this After Visit Summary.  MyChart is used to connect with patients for Virtual Visits (Telemedicine).  Patients are able to view lab/test results, encounter notes, upcoming appointments, etc.  Non-urgent messages can be sent to your provider as well.   To learn more about what you can do with MyChart, go to [email or web address].    Your next appointment:   6 month(s)  The format for your next appointment:   In Person  Provider:   You may see Brian Crenshaw, MD or one of the following Advanced Practice Providers on your designated Care Team:    Luke Kilroy, PA-C  Callie Goodrich, PA-C  Jesse Cleaver, FNP     

## 2019-11-19 NOTE — Progress Notes (Signed)
HPI: FU CAD. Also with h/o of diabetes mellitus, hypertension and hyperlipidemia.  Abdominal ultrasound January 2019 showed no aneurysm. CTA November 14, 1999 showed nonobstructive disease other than 50 to 69% stenosis in the mid LAD. Calcium score 286.  FFR 0.77.  Since last seen patient has dyspnea with more vigorous activities but not routine activities.  No pedal edema or syncope.  He has occasional pain in his left shoulder with activities but also with moving his left upper extremity and certain positions.  He does not have exertional chest pain.  Current Outpatient Medications  Medication Sig Dispense Refill  . aspirin EC 81 MG tablet Take 1 tablet (81 mg total) by mouth daily. Swallow whole. 90 tablet 3  . Blood Glucose Monitoring Suppl (ONE TOUCH ULTRA 2) w/Device KIT USE AS DIRECTED    . cloNIDine (CATAPRES) 0.1 MG tablet Take 1 tablet by mouth 2 (two) times daily.    . cyanocobalamin ([employer] VITAMIN B12) 1000 MCG tablet Take by mouth.    . diltiazem (CARDIZEM CD) 300 MG 24 hr capsule TAKE ONE CAPSULE (300 MG TOTAL) BY MOUTH DAILY.    Marland Kitchen FLUoxetine (PROZAC) 20 MG capsule Take 20 mg by mouth daily.    Marland Kitchen glucose blood test strip USE AS DIRECTED ONCE DAILY    . metFORMIN (GLUCOPHAGE-XR) 750 MG 24 hr tablet TAKE ONE TABLET (750 MG DOSE) BY MOUTH WITH BREAKFAST.    Marland Kitchen Omega-3 Fatty Acids (FISH OIL) 1000 MG CAPS Take 2,000 mg by mouth daily.     Marland Kitchen omeprazole (PRILOSEC) 40 MG capsule TAKE ONE CAPSULE (40 MG DOSE) BY MOUTH DAILY.    Glory Rosebush Delica Lancets 14N MISC TEST DAILY    . rosuvastatin (CRESTOR) 40 MG tablet Take 1 tablet (40 mg total) by mouth daily. 90 tablet 3  . valsartan-hydrochlorothiazide (DIOVAN-HCT) 320-25 MG tablet Take 1 tablet by mouth daily.     No current facility-administered medications for this visit.     Past Medical History:  Diagnosis Date  . Allergy    Pollen - no meds   . Anxiety   . Arthritis    left shoulder, neck, back   . Cataract    removed  both eyes   . Diabetes mellitus without complication ([place])    On Metformin   . GERD (gastroesophageal reflux disease)   . History of adenomatous polyp of colon 07/30/2014  . Hypertension   . Polyp of colon     Past Surgical History:  Procedure Laterality Date  . CATARACT EXTRACTION Bilateral   . COLONOSCOPY    . COLONOSCOPY N/A 02/11/2015   Procedure: COLONOSCOPY;  Surgeon: Gatha Mayer, MD;  Location: [facility];  Service: Endoscopy;  Laterality: N/A;  . COLONOSCOPY WITH PROPOFOL N/A 10/07/2014   Procedure: COLONOSCOPY WITH PROPOFOL;  Surgeon: Inda Castle, MD;  Location: [facility];  Service: Endoscopy;  Laterality: N/A;  . COLONOSCOPY WITH PROPOFOL N/A 11/04/2015   Procedure: COLONOSCOPY WITH PROPOFOL;  Surgeon: Gatha Mayer, MD;  Location: [facility];  Service: Endoscopy;  Laterality: N/A;  . HOT HEMOSTASIS N/A 10/07/2014   Procedure: HOT HEMOSTASIS (ARGON PLASMA COAGULATION/BICAP);  Surgeon: Inda Castle, MD;  Location: [facility];  Service: Endoscopy;  Laterality: N/A;  . HOT HEMOSTASIS N/A 02/11/2015   Procedure: HOT HEMOSTASIS (ARGON PLASMA COAGULATION/BICAP);  Surgeon: Gatha Mayer, MD;  Location: [facility];  Service: Endoscopy;  Laterality: N/A;  . POLYPECTOMY    . right foot    .  SHOULDER ARTHROSCOPY W/ ROTATOR CUFF REPAIR     both shoulders    Social History   Socioeconomic History  . Marital status: Married    Spouse name: Not on file  . Number of children: 2  . Years of education: Not on file  . Highest education level: Not on file  Occupational History  . Not on file  Tobacco Use  . Smoking status: Former Smoker    Quit date: 10/19/1980    Years since quitting: 39.1  . Smokeless tobacco: Never Used  Substance and Sexual Activity  . Alcohol use: Yes    Alcohol/week: 1.0 standard drink    Types: 1 Cans of beer per week    Comment: occasionally  . Drug use: No  . Sexual activity: Not on file  Other Topics Concern  . Not on file  Social  History Narrative  . Not on file   Social Determinants of Health   Financial Resource Strain:   . Difficulty of Paying Living Expenses: Not on file  Food Insecurity:   . Worried About Charity fundraiser in the Last Year: Not on file  . Ran Out of Food in the Last Year: Not on file  Transportation Needs:   . Lack of Transportation (Medical): Not on file  . Lack of Transportation (Non-Medical): Not on file  Physical Activity:   . Days of Exercise per Week: Not on file  . Minutes of Exercise per Session: Not on file  Stress:   . Feeling of Stress : Not on file  Social Connections:   . Frequency of Communication with Friends and Family: Not on file  . Frequency of Social Gatherings with Friends and Family: Not on file  . Attends Religious Services: Not on file  . Active Member of Clubs or Organizations: Not on file  . Attends Archivist Meetings: Not on file  . Marital Status: Not on file  Intimate Partner Violence:   . Fear of Current or Ex-Partner: Not on file  . Emotionally Abused: Not on file  . Physically Abused: Not on file  . Sexually Abused: Not on file    Family History  Problem Relation Age of Onset  . Colon cancer Brother   . Colon polyps Brother   . Esophageal cancer Neg Hx   . Rectal cancer Neg Hx   . Stomach cancer Neg Hx     ROS: no fevers or chills, productive cough, hemoptysis, dysphasia, odynophagia, melena, hematochezia, dysuria, hematuria, rash, seizure activity, orthopnea, PND, pedal edema, claudication. Remaining systems are negative.  Physical Exam: Well-developed obese in no acute distress.  Skin is warm and dry.  HEENT is normal.  Neck is supple.  Chest is clear to auscultation with normal expansion.  Cardiovascular exam is regular rate and rhythm.  Abdominal exam nontender or distended. No masses palpated. Extremities show no edema. neuro grossly intact  A/P  1 coronary disease-I have reviewed the patient's CTA with him.  He has  moderate coronary disease in the mid LAD but otherwise nonobstructive disease.  His FFR was abnormal.  However I am not convinced he is having symptoms (he has some pain in his left shoulder that increases with certain positions and certain arm movements and likely musculoskeletal).  I therefore think we can treat medically at this point.  We will continue with aspirin and statin.  We will consider catheterization if his symptoms change and we discussed the symptoms to be aware of.  2 hypertension-blood  pressure is elevated.  He will follow this at home and we will advance regimen as needed.  3 hyperlipidemia-continue statin.  4 diabetes mellitus-Per primary care.  5 obesity-we discussed the importance of diet, exercise and weight loss.  Kirk Ruths, MD

## 2020-02-04 LAB — LIPID PANEL
Chol/HDL Ratio: 3 ratio (ref 0.0–5.0)
Cholesterol, Total: 86 mg/dL — ABNORMAL LOW (ref 100–199)
HDL: 29 mg/dL — ABNORMAL LOW (ref >39)
LDL Chol Calc (NIH): 36 mg/dL (ref 0–99)
Triglycerides: 114 mg/dL (ref 0–149)
VLDL Cholesterol Cal: 21 mg/dL (ref 5–40)

## 2020-02-04 LAB — HEPATIC FUNCTION PANEL
ALT: 22 IU/L (ref 0–44)
AST: 23 IU/L (ref 0–40)
Albumin: 4.5 g/dL (ref 3.7–4.7)
Alkaline Phosphatase: 70 IU/L (ref 44–121)
Bilirubin Total: 0.3 mg/dL (ref 0.0–1.2)
Bilirubin, Direct: 0.1 mg/dL (ref 0.00–0.40)
Total Protein: 6.9 g/dL (ref 6.0–8.5)

## 2020-04-01 ENCOUNTER — Ambulatory Visit (INDEPENDENT_AMBULATORY_CARE_PROVIDER_SITE_OTHER): Payer: Medicare HMO

## 2020-04-01 ENCOUNTER — Ambulatory Visit: Payer: Self-pay

## 2020-04-01 ENCOUNTER — Ambulatory Visit (INDEPENDENT_AMBULATORY_CARE_PROVIDER_SITE_OTHER): Payer: Medicare HMO | Admitting: Orthopedic Surgery

## 2020-04-01 DIAGNOSIS — M25512 Pain in left shoulder: Secondary | ICD-10-CM

## 2020-04-01 DIAGNOSIS — M25511 Pain in right shoulder: Secondary | ICD-10-CM

## 2020-04-01 DIAGNOSIS — G8929 Other chronic pain: Secondary | ICD-10-CM | POA: Diagnosis not present

## 2020-04-14 ENCOUNTER — Encounter: Payer: Self-pay | Admitting: Orthopedic Surgery

## 2020-04-14 NOTE — Progress Notes (Signed)
Office Visit Note   Patient: Paul Hicks           Date of Birth: 11/20/1950           MRN: [id number] Visit Date: 04/01/2020              Requested by: Chesley Noon, MD [address] [place],  [place] [address] PCP: Chesley Noon, MD  Chief Complaint  Patient presents with  . Left Shoulder - Pain  . Right Shoulder - Pain      HPI: Patient is a 70 year old gentleman who presents with bilateral shoulder pain.  Patient is status post previous arthroscopic debridement years ago.  Patient has also had steroid injections in the past.  Assessment & Plan: Visit Diagnoses:  1. Chronic pain of both shoulders     Plan: Recommended therapy and strengthening recommended over-the-counter orthotics.  Follow-Up Instructions: Return in about 4 weeks (around 04/29/2020).   Ortho Exam  Patient is alert, oriented, no adenopathy, well-dressed, normal affect, normal respiratory effort. Examination patient has abduction and flexion to 90 degrees he has pain with Neer and Hawkins impingement test pain with a drop arm test.  No glenohumeral instability no adhesive capsulitis.  Imaging: No results found. No images are attached to the encounter.  Labs: No results found for: HGBA1C, ESRSEDRATE, CRP, LABURIC, REPTSTATUS, GRAMSTAIN, CULT, LABORGA   Lab Results  Component Value Date   ALBUMIN 4.5 02/04/2020   ALBUMIN 4.5 11/08/2019   ALBUMIN 4.0 01/19/2010    No results found for: MG No results found for: VD25OH  No results found for: PREALBUMIN CBC EXTENDED Latest Ref Rng & Units 01/19/2010  WBC 4.0 - 10.5 K/uL 9.9  RBC 4.22 - 5.81 MIL/uL 6.12(H)  HGB 13.0 - 17.0 g/dL 17.5(H)  HCT 39.0 - 52.0 % 51.6  PLT 150 - 400 K/uL 216  NEUTROABS 1.7 - 7.7 K/uL 8.4(H)  LYMPHSABS 0.7 - 4.0 K/uL 0.8     There is no height or weight on file to calculate BMI.  Orders:  Orders Placed This Encounter  Procedures  . XR Shoulder Left  . XR Shoulder Right  . Ambulatory referral to  Physical Therapy   No orders of the defined types were placed in this encounter.    Procedures: No procedures performed  Clinical Data: No additional findings.  ROS:  All other systems negative, except as noted in the HPI. Review of Systems  Objective: Vital Signs: There were no vitals taken for this visit.  Specialty Comments:  No specialty comments available.  PMFS History: Patient Active Problem List   Diagnosis Date Noted  . Diverticulosis of colon without hemorrhage   . Family history of colon cancer - brother 02/11/2015  . History of adenomatous polyp of colon 07/30/2014   Past Medical History:  Diagnosis Date  . Allergy    Pollen - no meds   . Anxiety   . Arthritis    left shoulder, neck, back   . Cataract    removed both eyes   . Diabetes mellitus without complication ([place])    On Metformin   . GERD (gastroesophageal reflux disease)   . History of adenomatous polyp of colon 07/30/2014  . Hypertension   . Polyp of colon     Family History  Problem Relation Age of Onset  . Colon cancer Brother   . Colon polyps Brother   . Esophageal cancer Neg Hx   . Rectal cancer Neg Hx   .  Stomach cancer Neg Hx     Past Surgical History:  Procedure Laterality Date  . CATARACT EXTRACTION Bilateral   . COLONOSCOPY    . COLONOSCOPY N/A 02/11/2015   Procedure: COLONOSCOPY;  Surgeon: Gatha Mayer, MD;  [facility]: [facility];  Service: Endoscopy;  Laterality: N/A;  . COLONOSCOPY WITH PROPOFOL N/A 10/07/2014   Procedure: COLONOSCOPY WITH PROPOFOL;  Surgeon: Inda Castle, MD;  Location: [facility];  Service: Endoscopy;  Laterality: N/A;  . COLONOSCOPY WITH PROPOFOL N/A 11/04/2015   Procedure: COLONOSCOPY WITH PROPOFOL;  Surgeon: Gatha Mayer, MD;  [facility]: [facility];  Service: Endoscopy;  Laterality: N/A;  . HOT HEMOSTASIS N/A 10/07/2014   Procedure: HOT HEMOSTASIS (ARGON PLASMA COAGULATION/BICAP);  Surgeon: Inda Castle, MD;  Location: [facility];   Service: Endoscopy;  Laterality: N/A;  . HOT HEMOSTASIS N/A 02/11/2015   Procedure: HOT HEMOSTASIS (ARGON PLASMA COAGULATION/BICAP);  Surgeon: Gatha Mayer, MD;  Location: [facility];  Service: Endoscopy;  Laterality: N/A;  . POLYPECTOMY    . right foot    . SHOULDER ARTHROSCOPY W/ ROTATOR CUFF REPAIR     both shoulders   Social History   Occupational History  . Not on file  Tobacco Use  . Smoking status: Former Smoker    Quit date: 10/19/1980    Years since quitting: 39.5  . Smokeless tobacco: Never Used  Substance and Sexual Activity  . Alcohol use: Yes    Alcohol/week: 1.0 standard drink    Types: 1 Cans of beer per week    Comment: occasionally  . Drug use: No  . Sexual activity: Not on file

## 2020-04-21 NOTE — Progress Notes (Signed)
HPI: FU CAD. Also with h/o of diabetes mellitus, hypertension and hyperlipidemia. Abdominal ultrasound January 2019 showed no aneurysm.CTA November 14, 1999 showed nonobstructive disease other than 50 to 69% stenosis in the mid LAD. Calcium score 286.  FFR 0.77.  Since last seen the patient has dyspnea with more extreme activities but not with routine activities. It is relieved with rest. It is not associated with chest pain. There is no orthopnea, PND or pedal edema. There is no syncope or palpitations. There is no exertional chest pain.   Current Outpatient Medications  Medication Sig Dispense Refill  . aspirin EC 81 MG tablet Take 1 tablet (81 mg total) by mouth daily. Swallow whole. 90 tablet 3  . Blood Glucose Monitoring Suppl (ONE TOUCH ULTRA 2) w/Device KIT USE AS DIRECTED    . cloNIDine (CATAPRES) 0.1 MG tablet Take 1 tablet by mouth 2 (two) times daily.    . cyanocobalamin 1000 MCG tablet Take by mouth.    . diltiazem (CARDIZEM CD) 300 MG 24 hr capsule TAKE ONE CAPSULE (300 MG TOTAL) BY MOUTH DAILY.    Marland Kitchen FLUoxetine (PROZAC) 20 MG capsule Take 20 mg by mouth daily.    Marland Kitchen glucose blood test strip USE AS DIRECTED ONCE DAILY    . metFORMIN (GLUCOPHAGE-XR) 750 MG 24 hr tablet TAKE ONE TABLET (750 MG DOSE) BY MOUTH WITH BREAKFAST.    Marland Kitchen Omega-3 Fatty Acids (FISH OIL) 1000 MG CAPS Take 2,000 mg by mouth daily.     Marland Kitchen omeprazole (PRILOSEC) 40 MG capsule TAKE ONE CAPSULE (40 MG DOSE) BY MOUTH DAILY.    Glory Rosebush Delica Lancets 56C MISC TEST DAILY    . valsartan-hydrochlorothiazide (DIOVAN-HCT) 320-25 MG tablet Take 1 tablet by mouth daily.    . rosuvastatin (CRESTOR) 40 MG tablet Take 1 tablet (40 mg total) by mouth daily. 90 tablet 3   No current facility-administered medications for this visit.     Past Medical History:  Diagnosis Date  . Allergy    Pollen - no meds   . Anxiety   . Arthritis    left shoulder, neck, back   . Cataract    removed both eyes   . Diabetes  mellitus without complication ([place])    On Metformin   . GERD (gastroesophageal reflux disease)   . History of adenomatous polyp of colon 07/30/2014  . Hypertension   . Polyp of colon     Past Surgical History:  Procedure Laterality Date  . CATARACT EXTRACTION Bilateral   . COLONOSCOPY    . COLONOSCOPY N/A 02/11/2015   Procedure: COLONOSCOPY;  Surgeon: Gatha Mayer, MD;  Location: [facility];  Service: Endoscopy;  Laterality: N/A;  . COLONOSCOPY WITH PROPOFOL N/A 10/07/2014   Procedure: COLONOSCOPY WITH PROPOFOL;  Surgeon: Inda Castle, MD;  Location: [facility];  Service: Endoscopy;  Laterality: N/A;  . COLONOSCOPY WITH PROPOFOL N/A 11/04/2015   Procedure: COLONOSCOPY WITH PROPOFOL;  Surgeon: Gatha Mayer, MD;  Location: [facility];  Service: Endoscopy;  Laterality: N/A;  . HOT HEMOSTASIS N/A 10/07/2014   Procedure: HOT HEMOSTASIS (ARGON PLASMA COAGULATION/BICAP);  Surgeon: Inda Castle, MD;  Location: [facility];  Service: Endoscopy;  Laterality: N/A;  . HOT HEMOSTASIS N/A 02/11/2015   Procedure: HOT HEMOSTASIS (ARGON PLASMA COAGULATION/BICAP);  Surgeon: Gatha Mayer, MD;  Location: [facility];  Service: Endoscopy;  Laterality: N/A;  . POLYPECTOMY    . right foot    . SHOULDER ARTHROSCOPY W/ ROTATOR CUFF  REPAIR     both shoulders    Social History   Socioeconomic History  . Marital status: Married    Spouse name: Not on file  . Number of children: 2  . Years of education: Not on file  . Highest education level: Not on file  Occupational History  . Not on file  Tobacco Use  . Smoking status: Former Smoker    Quit date: 10/19/1980    Years since quitting: 39.5  . Smokeless tobacco: Never Used  Substance and Sexual Activity  . Alcohol use: Yes    Alcohol/week: 1.0 standard drink    Types: 1 Cans of beer per week    Comment: occasionally  . Drug use: No  . Sexual activity: Not on file  Other Topics Concern  . Not on file  Social History Narrative  .  Not on file   Social Determinants of Health   Financial Resource Strain: Not on file  Food Insecurity: Not on file  Transportation Needs: Not on file  Physical Activity: Not on file  Stress: Not on file  Social Connections: Not on file  Intimate Partner Violence: Not on file    Family History  Problem Relation Age of Onset  . Colon cancer Brother   . Colon polyps Brother   . Esophageal cancer Neg Hx   . Rectal cancer Neg Hx   . Stomach cancer Neg Hx     ROS: no fevers or chills, productive cough, hemoptysis, dysphasia, odynophagia, melena, hematochezia, dysuria, hematuria, rash, seizure activity, orthopnea, PND, pedal edema, claudication. Remaining systems are negative.  Physical Exam: Well-developed well-nourished in no acute distress.  Skin is warm and dry.  HEENT is normal.  Neck is supple.  Chest is clear to auscultation with normal expansion.  Cardiovascular exam is regular rate and rhythm.  Abdominal exam nontender or distended. No masses palpated. Extremities show no edema. neuro grossly intact  ECG- personally reviewed  A/P  1 coronary artery disease-as outlined in previous note his cardiac CTA demonstrated moderate disease in the mid LAD but otherwise no other obstructive disease.  His FFR was abnormal but he is not having significant chest pain.  We have therefore elected to treat medically.  Continue aspirin and statin.  We will consider cardiac catheterization in the future if his symptoms worsen.  2 hypertension-blood pressure elevated; however he has not taken his medications today.  He will follow his blood pressure at home and we will adjust based on follow-up readings.  3 hyperlipidemia-continue statin.  4 morbid obesity-we discussed importance of weight loss.  Kirk Ruths, MD

## 2020-04-28 ENCOUNTER — Ambulatory Visit: Payer: Medicare HMO | Admitting: Cardiology

## 2020-04-29 ENCOUNTER — Encounter: Payer: Self-pay | Admitting: Physical Therapy

## 2020-04-29 ENCOUNTER — Ambulatory Visit: Payer: Medicare HMO | Attending: Orthopedic Surgery | Admitting: Physical Therapy

## 2020-04-29 ENCOUNTER — Other Ambulatory Visit: Payer: Self-pay

## 2020-04-29 DIAGNOSIS — G8929 Other chronic pain: Secondary | ICD-10-CM | POA: Diagnosis present

## 2020-04-29 DIAGNOSIS — M25511 Pain in right shoulder: Secondary | ICD-10-CM | POA: Diagnosis present

## 2020-04-29 DIAGNOSIS — M545 Low back pain, unspecified: Secondary | ICD-10-CM | POA: Diagnosis present

## 2020-04-29 DIAGNOSIS — M25512 Pain in left shoulder: Secondary | ICD-10-CM | POA: Diagnosis present

## 2020-04-29 NOTE — Therapy (Signed)
[facility] [address] [place], [place], [address] Phone: [phone]   Fax:  [phone]  Physical Therapy Evaluation  Patient Details  Name: Paul Hicks MRN: [id number] Date of Birth: 1950-04-09 Referring Provider (PT): Meridee Score   Encounter Date: 04/29/2020   PT End of Session - 04/29/20 0758    Visit Number 1    Date for PT Re-Evaluation 06/24/20    Authorization Type [employer] MCR    Progress Note Due on Visit 10    PT Start Time 0800    PT Stop Time 0845    PT Time Calculation (min) 45 min    Activity Tolerance Patient tolerated treatment well    Behavior During Therapy [facility] for tasks assessed/performed           Past Medical History:  Diagnosis Date  . Allergy    Pollen - no meds   . Anxiety   . Arthritis    left shoulder, neck, back   . Cataract    removed both eyes   . Diabetes mellitus without complication ([place])    On Metformin   . GERD (gastroesophageal reflux disease)   . History of adenomatous polyp of colon 07/30/2014  . Hypertension   . Polyp of colon     Past Surgical History:  Procedure Laterality Date  . CATARACT EXTRACTION Bilateral   . COLONOSCOPY    . COLONOSCOPY N/A 02/11/2015   Procedure: COLONOSCOPY;  Surgeon: Gatha Mayer, MD;  Location: [facility];  Service: Endoscopy;  Laterality: N/A;  . COLONOSCOPY WITH PROPOFOL N/A 10/07/2014   Procedure: COLONOSCOPY WITH PROPOFOL;  Surgeon: Inda Castle, MD;  Location: [facility];  Service: Endoscopy;  Laterality: N/A;  . COLONOSCOPY WITH PROPOFOL N/A 11/04/2015   Procedure: COLONOSCOPY WITH PROPOFOL;  Surgeon: Gatha Mayer, MD;  Location: [facility];  Service: Endoscopy;  Laterality: N/A;  . HOT HEMOSTASIS N/A 10/07/2014   Procedure: HOT HEMOSTASIS (ARGON PLASMA COAGULATION/BICAP);  Surgeon: Inda Castle, MD;  Location: [facility];  Service: Endoscopy;  Laterality: N/A;  . HOT HEMOSTASIS N/A 02/11/2015   Procedure: HOT  HEMOSTASIS (ARGON PLASMA COAGULATION/BICAP);  Surgeon: Gatha Mayer, MD;  Location: [facility];  Service: Endoscopy;  Laterality: N/A;  . POLYPECTOMY    . right foot    . SHOULDER ARTHROSCOPY W/ ROTATOR CUFF REPAIR     both shoulders    There were no vitals filed for this visit.    Subjective Assessment - 04/29/20 0802    Subjective Bil shoulders have progressively been getting worse. He has pain down the left lateral shoulder and chest. He had a fall 3 weeks ago and tripped over a cord and hurt his left ribs which he feels when raising his arm as well. Can't do anything for very long before they start to hurt eg. tilling x 10  min. Patient also reports significant LBP.    Pertinent History bil shoulder scopes, OA, HTN, DM, LBP    Diagnostic tests xrays - left small bone spur; and both degeneration    Patient Stated Goals to have less pain    Currently in Pain? Yes    Pain Score 6    no pain at rest   Pain Location Shoulder    Pain Orientation Left;Right    Pain Descriptors / Indicators Pressure    Pain Type Chronic pain    Pain Radiating Towards chest (pectoralis) and arm    Pain Onset More than a  month ago    Pain Frequency Intermittent    Aggravating Factors  any activity    Pain Relieving Factors rest    Effect of Pain on Daily Activities limited              [facility] PT Assessment - 04/29/20 0001      Assessment   Medical Diagnosis chronic pain of both shoulders    Referring Provider (PT) Meridee Score    Onset Date/Surgical Date 10/24/19    Hand Dominance Left      Precautions   Precautions None      Restrictions   Weight Bearing Restrictions No      Balance Screen   Has the patient fallen in the past 6 months Yes    How many times? 1    Has the patient had a decrease in activity level because of a fear of falling?  No    Is the patient reluctant to leave their home because of a fear of falling?  No      Home Ecologist  residence    Living Arrangements Spouse/significant other      Prior Function   Level of [place] Retired    Leisure gardens, walks 3x/wk      Observation/Other Assessments   Focus on Therapeutic Outcomes (FOTO)  FS score = 63; goal 68      ROM / Strength   AROM / PROM / Strength AROM;PROM;Strength      AROM   Overall AROM Comments Bil shoulders WFL    AROM Assessment Site Shoulder    Right/Left Shoulder Right;Left    Right Shoulder Flexion 165 Degrees    Right Shoulder ABduction 160 Degrees    Right Shoulder Internal Rotation --   full   Right Shoulder External Rotation --   full   Left Shoulder Flexion 170 Degrees    Left Shoulder ABduction 125 Degrees   pain into lateral arm   Left Shoulder Internal Rotation 58 Degrees   pain in ant shoulder with active IR behind his back   Left Shoulder External Rotation 68 Degrees   pain at end range     PROM   PROM Assessment Site Shoulder    Right/Left Shoulder Right;Left    Right Shoulder Flexion 170 Degrees   pain   Right Shoulder ABduction 180 Degrees    Left Shoulder Flexion 180 Degrees    Left Shoulder ABduction 155 Degrees    Left Shoulder Internal Rotation 72 Degrees    Left Shoulder External Rotation 75 Degrees      Strength   Strength Assessment Site Shoulder    Right/Left Shoulder Right;Left    Right Shoulder Flexion 4+/5    Right Shoulder Extension 4+/5    Right Shoulder ABduction 4+/5    Right Shoulder Internal Rotation 5/5    Right Shoulder External Rotation 4+/5    Left Shoulder Flexion 4/5    Left Shoulder Extension 5/5    Left Shoulder ABduction 4/5    Left Shoulder Internal Rotation 5/5    Left Shoulder External Rotation 4+/5      Palpation   Palpation comment ant cuff left shoulder, left pecs, lower ribs and obliques      Special Tests   Other special tests pain bil with impingement and HK tests                      Objective measurements  completed on  examination: See above findings.               PT Education - 04/29/20 0944    Education Details HEP; basic ed on posture for shoulders and low back    Person(s) Educated Patient    Methods Explanation;Demonstration;Handout    Comprehension Verbalized understanding;Returned demonstration            PT Short Term Goals - 04/29/20 1006      PT SHORT TERM GOAL #1   Title Ind with initial HEP    Time 4    Period Weeks    Status New    Target Date 05/27/20      PT SHORT TERM GOAL #2   Title Decreased shoulder pain by 25% with ADLS    Time 4    Period Weeks    Status New      PT SHORT TERM GOAL #3   Title LBP assessed and goals set (if approved by MD)    Time 4    Period Weeks    Status New             PT Long Term Goals - 04/29/20 1009      PT LONG TERM GOAL #1   Title Ind with advanced HEP    Time 8    Period Weeks    Status New    Target Date 06/24/20      PT LONG TERM GOAL #2   Title Patient to report increased endurance with ADLS such as yard work to 30 min or more without having to stop due to shoulder pain.    Time 8    Period Weeks    Status New      PT LONG TERM GOAL #3   Title Patient to report decreased shoulder pain by 50% or more with ADLS.    Time 8    Period Weeks    Status New      PT LONG TERM GOAL #4   Title Improved FOTO score to >= 68.    Time 8    Period Weeks    Status New      PT LONG TERM GOAL #5   Title Patient to report decreased shoulder pain hauling his trash cans to corner by 50%.    Time 8    Period Weeks    Status New                  Plan - 04/29/20 0945    Clinical Impression Statement Patient presents today with reports of chronic bil shoulder pain that is progressively worsening with ADLS. He also reports chronic low back pain with ADLS. Patient is limited to about 10 min of physical activity with his shoulders before he needs to rest due to pain. Patient also had a fall 3 wks ago when he tripped  over a cord and fell onto his left side. He has marked tenderness of lower left ribs and obliques and feels this somewhat with OH motion of his shoulder as well. Paul Hicks has had bil shoulder arthroscopy in the past with great success. Xrays show degeneration bil and a small bone spur in the left shoulder.He has pain into the left lateral arm and pectorals with active flex and ABD. He has funtional ROM bil and mild strength deficits left > right. Posturally he would benefit from scapular strengthening.  He will benefit from skilled PT to address these deficits and improve  tolerance to daily ADLS. PT discussed with pt that he would benefit from skilled PT to address his LBP as well. He reports debilitating pain to the point of making him sick to his stomach with activities such as standing upright after shaving at the sink. LBP was not assessed today but as shoulders improve, pt would like to address this pain as well.    Personal Factors and Comorbidities Comorbidity 3+    Comorbidities bil shoulder scopes, OA, HTN, DM, LBP    Examination-Activity Limitations Reach Overhead;Carry    Examination-Participation Restrictions Yard Work    Stability/Clinical Decision Making Stable/Uncomplicated    Clinical Decision Making Low    Rehab Potential Good    PT Frequency 1x / week    PT Duration 8 weeks    PT Treatment/Interventions ADLs/Self Care Home Management;Cryotherapy;Electrical Stimulation;Moist Heat;Therapeutic activities;Therapeutic exercise;Patient/family education;Manual techniques;Dry needling;Traction;Neuromuscular re-education    PT Next Visit Plan Shoulder and postural strengthening; lats and trunk flexibilty/rotation; assess LBP if approved by MD once shoulders addressed; NO IONTO (due to insurance)    PT Home Exercise Plan [facility]    Consulted and Agree with Plan of Care Patient           Patient will benefit from skilled therapeutic intervention in order to improve the following deficits  and impairments:  Decreased endurance,Increased muscle spasms,Improper body mechanics,Decreased range of motion,Decreased strength,Impaired UE functional use,Pain  Visit Diagnosis: Chronic left shoulder pain - Plan: PT plan of care cert/re-cert  Chronic right shoulder pain - Plan: PT plan of care cert/re-cert  Chronic bilateral low back pain, unspecified whether sciatica present - Plan: PT plan of care cert/re-cert     Problem List Patient Active Problem List   Diagnosis Date Noted  . Diverticulosis of colon without hemorrhage   . Family history of colon cancer - brother 02/11/2015  . History of adenomatous polyp of colon 07/30/2014    Madelyn Flavors PT 04/29/2020, 10:18 AM  [facility] [address] [place], [place], [address] Phone: [phone]   Fax:  [phone]  Name: Paul Hicks MRN: [id number] Date of Birth: 05-Mar-1950

## 2020-04-29 NOTE — Patient Instructions (Signed)
Access Code: [email or web address] URL: [email or web address] Date: 04/29/2020 Prepared by: Almyra Free  Exercises Shoulder External Rotation and Scapular Retraction with Resistance - 1 x daily - 7 x weekly - 3 sets - 10 reps Standing Shoulder Flexion with Resistance - 1 x daily - 7 x weekly - 3 sets - 10 reps Standing Single Arm Shoulder Abduction with Resistance - 1 x daily - 7 x weekly - 3 sets - 10 reps

## 2020-05-05 ENCOUNTER — Other Ambulatory Visit: Payer: Self-pay

## 2020-05-05 ENCOUNTER — Ambulatory Visit: Payer: Medicare HMO | Admitting: Cardiology

## 2020-05-05 ENCOUNTER — Ambulatory Visit: Payer: Medicare HMO

## 2020-05-05 ENCOUNTER — Encounter: Payer: Self-pay | Admitting: Cardiology

## 2020-05-05 VITALS — BP 162/92 | HR 78 | Ht 72.0 in | Wt 284.0 lb

## 2020-05-05 DIAGNOSIS — I10 Essential (primary) hypertension: Secondary | ICD-10-CM

## 2020-05-05 DIAGNOSIS — G8929 Other chronic pain: Secondary | ICD-10-CM

## 2020-05-05 DIAGNOSIS — I2510 Atherosclerotic heart disease of native coronary artery without angina pectoris: Secondary | ICD-10-CM

## 2020-05-05 DIAGNOSIS — E7800 Pure hypercholesterolemia, unspecified: Secondary | ICD-10-CM

## 2020-05-05 DIAGNOSIS — M25512 Pain in left shoulder: Secondary | ICD-10-CM | POA: Diagnosis not present

## 2020-05-05 NOTE — Patient Instructions (Signed)
Access Code: [email or web address] URL: [email or web address] Date: 05/05/2020 Prepared by: Claiborne Billings   Supine Shoulder Horizontal Abduction with Resistance - 1 x daily - 7 x weekly - 2 sets - 10 reps

## 2020-05-05 NOTE — Therapy (Signed)
[facility] [address] [place], [place], [address] Phone: [phone]   Fax:  [phone]  Physical Therapy Treatment  Patient Details  Name: Paul Hicks MRN: [id number] Date of Birth: 12-Apr-1950 Referring Provider (PT): Meridee Score   Encounter Date: 05/05/2020   PT End of Session - 05/05/20 1232    Visit Number 2    Date for PT Re-Evaluation 06/24/20    Authorization Type [employer] MCR    Progress Note Due on Visit 10    PT Start Time 1147    PT Stop Time 1230    PT Time Calculation (min) 43 min    Activity Tolerance Patient tolerated treatment well    Behavior During Therapy [facility] for tasks assessed/performed           Past Medical History:  Diagnosis Date  . Allergy    Pollen - no meds   . Anxiety   . Arthritis    left shoulder, neck, back   . Cataract    removed both eyes   . Diabetes mellitus without complication ([place])    On Metformin   . GERD (gastroesophageal reflux disease)   . History of adenomatous polyp of colon 07/30/2014  . Hypertension   . Polyp of colon     Past Surgical History:  Procedure Laterality Date  . CATARACT EXTRACTION Bilateral   . COLONOSCOPY    . COLONOSCOPY N/A 02/11/2015   Procedure: COLONOSCOPY;  Surgeon: Gatha Mayer, MD;  Location: [facility];  Service: Endoscopy;  Laterality: N/A;  . COLONOSCOPY WITH PROPOFOL N/A 10/07/2014   Procedure: COLONOSCOPY WITH PROPOFOL;  Surgeon: Inda Castle, MD;  Location: [facility];  Service: Endoscopy;  Laterality: N/A;  . COLONOSCOPY WITH PROPOFOL N/A 11/04/2015   Procedure: COLONOSCOPY WITH PROPOFOL;  Surgeon: Gatha Mayer, MD;  Location: [facility];  Service: Endoscopy;  Laterality: N/A;  . HOT HEMOSTASIS N/A 10/07/2014   Procedure: HOT HEMOSTASIS (ARGON PLASMA COAGULATION/BICAP);  Surgeon: Inda Castle, MD;  Location: [facility];  Service: Endoscopy;  Laterality: N/A;  . HOT HEMOSTASIS N/A 02/11/2015   Procedure: HOT  HEMOSTASIS (ARGON PLASMA COAGULATION/BICAP);  Surgeon: Gatha Mayer, MD;  Location: [facility];  Service: Endoscopy;  Laterality: N/A;  . POLYPECTOMY    . right foot    . SHOULDER ARTHROSCOPY W/ ROTATOR CUFF REPAIR     both shoulders    There were no vitals filed for this visit.   Subjective Assessment - 05/05/20 1145    Subjective I have worked my Lt shoulder more becasue it bothers me more .    Patient Stated Goals to have less pain    Currently in Pain? Yes    Pain Score 0-No pain   up to 5-7/10 with movement   Pain Location Shoulder    Pain Orientation Left;Right    Pain Descriptors / Indicators Pressure    Pain Type Chronic pain    Pain Onset More than a month ago    Pain Frequency Intermittent    Aggravating Factors  any activity    Pain Relieving Factors rest                             [facility] Adult PT Treatment/Exercise - 05/05/20 0001      Exercises   Exercises Shoulder      Shoulder Exercises: Supine   Horizontal ABduction Both;20 reps;Theraband    Theraband Level (Shoulder  Horizontal ABduction) Level 2 (Red)      Shoulder Exercises: Seated   Flexion Strengthening;Both;20 reps;Theraband;Weights    Flexion Limitations 1 set with 2# weight, 1 set with theraband (red)    Abduction Strengthening;Both;20 reps;Weights;Theraband    Theraband Level (Shoulder ABduction) Level 2 (Red)    ABduction Weight (lbs) 1 set with red band, 1 set with 2# weight      Shoulder Exercises: Standing   Extension Strengthening;Both;20 reps;Theraband    Theraband Level (Shoulder Extension) Level 2 (Red)    Row Strengthening;Both;20 reps;Theraband    Theraband Level (Shoulder Row) Level 2 (Red)      Shoulder Exercises: ROM/Strengthening   UBE (Upper Arm Bike) Level 1x 6 (3/3)- PT present to discuss progress                  PT Education - 05/05/20 1221    Education Details Access Code: [facility]    Person(s) Educated Patient    Methods  Explanation;Demonstration;Handout    Comprehension Returned demonstration;Verbalized understanding            PT Short Term Goals - 04/29/20 1006      PT SHORT TERM GOAL #1   Title Ind with initial HEP    Time 4    Period Weeks    Status New    Target Date 05/27/20      PT SHORT TERM GOAL #2   Title Decreased shoulder pain by 25% with ADLS    Time 4    Period Weeks    Status New      PT SHORT TERM GOAL #3   Title LBP assessed and goals set (if approved by MD)    Time 4    Period Weeks    Status New             PT Long Term Goals - 04/29/20 1009      PT LONG TERM GOAL #1   Title Ind with advanced HEP    Time 8    Period Weeks    Status New    Target Date 06/24/20      PT LONG TERM GOAL #2   Title Patient to report increased endurance with ADLS such as yard work to 30 min or more without having to stop due to shoulder pain.    Time 8    Period Weeks    Status New      PT LONG TERM GOAL #3   Title Patient to report decreased shoulder pain by 50% or more with ADLS.    Time 8    Period Weeks    Status New      PT LONG TERM GOAL #4   Title Improved FOTO score to >= 68.    Time 8    Period Weeks    Status New      PT LONG TERM GOAL #5   Title Patient to report decreased shoulder pain hauling his trash cans to corner by 50%.    Time 8    Period Weeks    Status New                 Plan - 05/05/20 1237    Clinical Impression Statement Pt is here for 1st time follow-up after evaluation.  Pt reported LBP with resisted shoulder exercise.  PT reviewed these exercises and offered modifications with use of holding a free weight, performing sitting and engaging abdominals to reduce lumbar strain. Pt required tactile and  verbal cues for alignment and technique with HEP.  Pt preferred doing shoulder flexion and abduction with free weights and will try this at home.  Pt had difficulty with transitional movements due to LBP.  Pt advised pt to use abdominal  bracing to assist with transitions.  Pt will continue to benefit from skilled PT to address bil shoulder pain, ROM, strength and LBP will be assessed as shoulder pain begins to resolve.    PT Frequency 1x / week    PT Duration 8 weeks    PT Treatment/Interventions ADLs/Self Care Home Management;Cryotherapy;Electrical Stimulation;Moist Heat;Therapeutic activities;Therapeutic exercise;Patient/family education;Manual techniques;Dry needling;Traction;Neuromuscular re-education    PT Next Visit Plan Shoulder and postural strengthening; lats and trunk flexibilty/rotation; assess LBP if approved by MD once shoulders addressed; NO IONTO (due to insurance)    PT Home Exercise Plan [facility]    Consulted and Agree with Plan of Care Patient           Patient will benefit from skilled therapeutic intervention in order to improve the following deficits and impairments:  Decreased endurance,Increased muscle spasms,Improper body mechanics,Decreased range of motion,Decreased strength,Impaired UE functional use,Pain  Visit Diagnosis: Chronic left shoulder pain  Chronic right shoulder pain  Chronic bilateral low back pain, unspecified whether sciatica present     Problem List Patient Active Problem List   Diagnosis Date Noted  . Diverticulosis of colon without hemorrhage   . Family history of colon cancer - brother 02/11/2015  . History of adenomatous polyp of colon 07/30/2014    Sigurd Sos, PT 05/05/20 12:38 PM   [facility] [address] [place], [place], [address] Phone: [phone]   Fax:  [phone]  Name: DAXTON NYDAM MRN: [id number] Date of Birth: 1950/03/20

## 2020-05-05 NOTE — Patient Instructions (Signed)

## 2020-05-22 ENCOUNTER — Ambulatory Visit: Payer: Medicare HMO

## 2020-05-22 ENCOUNTER — Other Ambulatory Visit: Payer: Self-pay

## 2020-05-22 DIAGNOSIS — G8929 Other chronic pain: Secondary | ICD-10-CM

## 2020-05-22 DIAGNOSIS — M545 Low back pain, unspecified: Secondary | ICD-10-CM

## 2020-05-22 DIAGNOSIS — M25512 Pain in left shoulder: Secondary | ICD-10-CM | POA: Diagnosis not present

## 2020-05-22 NOTE — Patient Instructions (Signed)
Access Code: [email or web address] URL: [email or web address] Date: 05/22/2020 Prepared by: Claiborne Billings   Doorway Pec Stretch at 90 Degrees Abduction - 2 x daily - 7 x weekly - 1 sets - 5 reps - 10 hold

## 2020-05-22 NOTE — Therapy (Signed)
[facility] [address] [place], [place], [address] Phone: [phone]   Fax:  [phone]  Physical Therapy Treatment  Patient Details  Name: Paul Hicks MRN: [id number] Date of Birth: Oct 25, 1950 Referring Provider (PT): Meridee Score   Encounter Date: 05/22/2020   PT End of Session - 05/22/20 0927    Visit Number 3    Date for PT Re-Evaluation 06/24/20    Authorization Type [employer] MCR    Progress Note Due on Visit 10    PT Start Time 0844    PT Stop Time 0927    PT Time Calculation (min) 43 min    Activity Tolerance Patient tolerated treatment well    Behavior During Therapy [facility] for tasks assessed/performed           Past Medical History:  Diagnosis Date  . Allergy    Pollen - no meds   . Anxiety   . Arthritis    left shoulder, neck, back   . Cataract    removed both eyes   . Diabetes mellitus without complication ([place])    On Metformin   . GERD (gastroesophageal reflux disease)   . History of adenomatous polyp of colon 07/30/2014  . Hypertension   . Polyp of colon     Past Surgical History:  Procedure Laterality Date  . CATARACT EXTRACTION Bilateral   . COLONOSCOPY    . COLONOSCOPY N/A 02/11/2015   Procedure: COLONOSCOPY;  Surgeon: Gatha Mayer, MD;  Location: [facility];  Service: Endoscopy;  Laterality: N/A;  . COLONOSCOPY WITH PROPOFOL N/A 10/07/2014   Procedure: COLONOSCOPY WITH PROPOFOL;  Surgeon: Inda Castle, MD;  [facility];  Service: Endoscopy;  Laterality: N/A;  . COLONOSCOPY WITH PROPOFOL N/A 11/04/2015   Procedure: COLONOSCOPY WITH PROPOFOL;  Surgeon: Gatha Mayer, MD;  [facility];  Service: Endoscopy;  Laterality: N/A;  . HOT HEMOSTASIS N/A 10/07/2014   Procedure: HOT HEMOSTASIS (ARGON PLASMA COAGULATION/BICAP);  Surgeon: Inda Castle, MD;  [facility];  Service: Endoscopy;  Laterality: N/A;  . HOT HEMOSTASIS N/A 02/11/2015   Procedure: HOT  HEMOSTASIS (ARGON PLASMA COAGULATION/BICAP);  Surgeon: Gatha Mayer, MD;  Location: [facility];  Service: Endoscopy;  Laterality: N/A;  . POLYPECTOMY    . right foot    . SHOULDER ARTHROSCOPY W/ ROTATOR CUFF REPAIR     both shoulders    There were no vitals filed for this visit.   Subjective Assessment - 05/22/20 0847    Subjective I don't see much difference in my shoulder pain.  I have been doing my exercises but not as much as I should    Currently in Pain? Yes    Pain Score 1    5-8/10 with use- using lawnmower   Pain Location Shoulder    Pain Orientation Left;Right    Pain Descriptors / Indicators Aching    Pain Type Chronic pain    Pain Onset More than a month ago    Pain Frequency Intermittent    Aggravating Factors  activity with arms    Pain Relieving Factors rest, not using arms                             [facility] Adult PT Treatment/Exercise - 05/22/20 0001      Shoulder Exercises: Seated   Horizontal ABduction Strengthening;Theraband;20 reps    External Rotation Strengthening;20 reps;Theraband    Theraband Level (  Shoulder External Rotation) Level 2 (Red)    Other Seated Exercises 3 way raises 2# 2x10      Shoulder Exercises: Pulleys   Flexion 3 minutes      Shoulder Exercises: ROM/Strengthening   UBE (Upper Arm Bike) Level 1x 6 (3/3)- PT present to discuss progress    Ball on Wall circles x10  CW/CCW      Shoulder Exercises: Music therapist Stretch 5 reps;10 seconds                  PT Education - 05/22/20 0908    Education Details Access Code: [facility]    Person(s) Educated Patient    Methods Explanation;Demonstration;Handout    Comprehension Verbalized understanding;Returned demonstration            PT Short Term Goals - 05/22/20 0902      PT SHORT TERM GOAL #1   Title Ind with initial HEP    Status On-going      PT SHORT TERM GOAL #2   Title Decreased shoulder pain by 25% with ADLS    Baseline not able to tell     Status On-going             PT Long Term Goals - 04/29/20 1009      PT LONG TERM GOAL #1   Title Ind with advanced HEP    Time 8    Period Weeks    Status New    Target Date 06/24/20      PT LONG TERM GOAL #2   Title Patient to report increased endurance with ADLS such as yard work to 30 min or more without having to stop due to shoulder pain.    Time 8    Period Weeks    Status New      PT LONG TERM GOAL #3   Title Patient to report decreased shoulder pain by 50% or more with ADLS.    Time 8    Period Weeks    Status New      PT LONG TERM GOAL #4   Title Improved FOTO score to >= 68.    Time 8    Period Weeks    Status New      PT LONG TERM GOAL #5   Title Patient to report decreased shoulder pain hauling his trash cans to corner by 50%.    Time 8    Period Weeks    Status New                 Plan - 05/22/20 0920    Clinical Impression Statement Pt with lapse in treatment x 2 weeks.  Pt reports moderate compliance with HEP. Pt reports no shoulder pain at rest and 5-8/10 pain with use of arms and this depends on activity. Pt denies any change in symptoms since the start.  Pt describes pain into pectoralis muscle when he has pain and PT added pec stretch to HEP. No palpable tenderness at the pectoralis or posterior shoulder girdle today.  Pt required verbal cueing for scapular retraction and depression with exercise.  PT educated pt on postural modifications to reduce scapular protraction with use of arms to determine if this helps to reduce pain.  Pt agreed to try this.  Pt fatigued with ball rolls on the wall and seated horizontal abduction and demonstrated compensatory motion with this.  Pt will continue to benefit from skilled PT to address bil shoulder pain, functional strength deficits  and postural abnormality.    PT Frequency 1x / week    PT Duration 8 weeks    PT Treatment/Interventions ADLs/Self Care Home Management;Cryotherapy;Electrical  Stimulation;Moist Heat;Therapeutic activities;Therapeutic exercise;Patient/family education;Manual techniques;Dry needling;Traction;Neuromuscular re-education    PT Next Visit Plan shoulder and postural strength, pec stretch    PT Home Exercise Plan [facility]    Recommended Other Services cert is signed    Consulted and Agree with Plan of Care Patient           Patient will benefit from skilled therapeutic intervention in order to improve the following deficits and impairments:  Decreased endurance,Increased muscle spasms,Improper body mechanics,Decreased range of motion,Decreased strength,Impaired UE functional use,Pain  Visit Diagnosis: Chronic left shoulder pain  Chronic right shoulder pain  Chronic bilateral low back pain, unspecified whether sciatica present     Problem List Patient Active Problem List   Diagnosis Date Noted  . Diverticulosis of colon without hemorrhage   . Family history of colon cancer - brother 02/11/2015  . History of adenomatous polyp of colon 07/30/2014     Sigurd Sos, PT 05/22/20 9:29 AM  [facility] [address], [place], [place], [address] Phone: [phone]   Fax:  [phone]  Name: Paul Hicks MRN: [id number] Date of Birth: [birth year]

## 2020-05-29 ENCOUNTER — Other Ambulatory Visit: Payer: Self-pay

## 2020-05-29 ENCOUNTER — Ambulatory Visit: Payer: Medicare HMO | Attending: Orthopedic Surgery

## 2020-05-29 DIAGNOSIS — M545 Low back pain, unspecified: Secondary | ICD-10-CM | POA: Insufficient documentation

## 2020-05-29 DIAGNOSIS — M25511 Pain in right shoulder: Secondary | ICD-10-CM | POA: Diagnosis present

## 2020-05-29 DIAGNOSIS — M25512 Pain in left shoulder: Secondary | ICD-10-CM | POA: Diagnosis not present

## 2020-05-29 DIAGNOSIS — G8929 Other chronic pain: Secondary | ICD-10-CM | POA: Insufficient documentation

## 2020-05-29 NOTE — Therapy (Signed)
[facility] [address], [place], [place], [address] Phone: [phone]   Fax:  [phone]  Physical Therapy Treatment  Patient Details  Name: SASCHA BAUGHER MRN: [id number] Date of Birth: 12-31-1950 Referring Provider (PT): Meridee Score   Encounter Date: 05/29/2020   PT End of Session - 05/29/20 0832    Visit Number 4    Authorization Type Aetna MCR    PT Start Time 0801    PT Stop Time 0834    PT Time Calculation (min) 33 min    Activity Tolerance Patient tolerated treatment well    Behavior During Therapy [facility] for tasks assessed/performed           Past Medical History:  Diagnosis Date  . Allergy    Pollen - no meds   . Anxiety   . Arthritis    left shoulder, neck, back   . Cataract    removed both eyes   . Diabetes mellitus without complication ([place])    On Metformin   . GERD (gastroesophageal reflux disease)   . History of adenomatous polyp of colon 07/30/2014  . Hypertension   . Polyp of colon     Past Surgical History:  Procedure Laterality Date  . CATARACT EXTRACTION Bilateral   . COLONOSCOPY    . COLONOSCOPY N/A 02/11/2015   Procedure: COLONOSCOPY;  Surgeon: Gatha Mayer, MD;  [facility];  Service: Endoscopy;  Laterality: N/A;  . COLONOSCOPY WITH PROPOFOL N/A 10/07/2014   Procedure: COLONOSCOPY WITH PROPOFOL;  Surgeon: Inda Castle, MD;  [facility];  Service: Endoscopy;  Laterality: N/A;  . COLONOSCOPY WITH PROPOFOL N/A 11/04/2015   Procedure: COLONOSCOPY WITH PROPOFOL;  Surgeon: Gatha Mayer, MD;  [facility];  Service: Endoscopy;  Laterality: N/A;  . HOT HEMOSTASIS N/A 10/07/2014   Procedure: HOT HEMOSTASIS (ARGON PLASMA COAGULATION/BICAP);  Surgeon: Inda Castle, MD;  Location: [facility];  Service: Endoscopy;  Laterality: N/A;  . HOT HEMOSTASIS N/A 02/11/2015   Procedure: HOT HEMOSTASIS (ARGON PLASMA COAGULATION/BICAP);  Surgeon: Gatha Mayer,  MD;  Location: [facility];  Service: Endoscopy;  Laterality: N/A;  . POLYPECTOMY    . right foot    . SHOULDER ARTHROSCOPY W/ ROTATOR CUFF REPAIR     both shoulders    There were no vitals filed for this visit.   Subjective Assessment - 05/29/20 0759    Subjective I don't see any difference.  My shoulders are sore from mowing the lawn yesterday.    Currently in Pain? Yes    Pain Score 2     Pain Location Shoulder    Pain Orientation Right;Left    Pain Descriptors / Indicators Aching    Pain Type Chronic pain    Pain Onset More than a month ago    Pain Frequency Intermittent    Aggravating Factors  use of arms    Pain Relieving Factors rest, not using arms              [facility] PT Assessment - 05/29/20 0001      Assessment   Medical Diagnosis chronic pain of both shoulders    Referring Provider (PT) Meridee Score    Onset Date/Surgical Date 10/24/19    Hand Dominance Left      Cognition   Overall Cognitive Status Within Functional Limits for tasks assessed      Observation/Other Assessments   Focus on Therapeutic Outcomes (FOTO)  63  Strength   Left Shoulder Flexion 4+/5    Left Shoulder External Rotation 4+/5                         [facility] Adult PT Treatment/Exercise - 05/29/20 0001      Shoulder Exercises: Seated   Horizontal ABduction Strengthening;Theraband;20 reps    External Rotation Strengthening;20 reps;Theraband    Theraband Level (Shoulder External Rotation) Level 2 (Red)    Other Seated Exercises 3 way raises 3# 2x10      Shoulder Exercises: ROM/Strengthening   UBE (Upper Arm Bike) Level 1x 6 (3/3)- PT present to discuss progress    Ball on Wall circles x10  CW/CCW      Shoulder Exercises: Stretch   Corner Stretch 5 reps;10 seconds                    PT Short Term Goals - 05/22/20 0902      PT SHORT TERM GOAL #1   Title Ind with initial HEP    Status On-going      PT SHORT TERM GOAL #2   Title Decreased shoulder  pain by 25% with ADLS    Baseline not able to tell    Status On-going             PT Long Term Goals - 05/29/20 0803      PT LONG TERM GOAL #1   Title Ind with advanced HEP    Status Achieved      PT LONG TERM GOAL #2   Title Patient to report increased endurance with ADLS such as yard work to 30 min or more without having to stop due to shoulder pain.    Baseline 30 min to 1 hour    Status Achieved      PT LONG TERM GOAL #3   Title Patient to report decreased shoulder pain by 50% or more with ADLS.    Baseline 20%    Status Partially Met      PT LONG TERM GOAL #4   Title Improved FOTO score to >= 68.      PT LONG TERM GOAL #5   Title Patient to report decreased shoulder pain hauling his trash cans to corner by 50%.    Baseline changed arms and now able to do this    Status Partially Met                 Plan - 05/29/20 0826    Clinical Impression Statement Pt reports 20% overall improvement in symptoms since the start of care and agrees to D/C to HEP for postural strength and shoulder flexibility/endurance today.  Pt has changed the arm that he is using to pull his trash cans and is able to do this without pain.  Pt is now able to use his arms for ADLs and yardwork for 30 minutes to 1 hour without significant limitation.  Lt shoulder flexion and ER strength has improved since the start of care.  PT reviewed all HEP with pt and discussed postural corrections and importance of alignment.  Pt will D/C to HEP today.    PT Next Visit Plan D/C PT to HEP    PT Home Exercise Plan [facility]    Consulted and Agree with Plan of Care Patient           Patient will benefit from skilled therapeutic intervention in order to improve the following deficits and impairments:  Visit Diagnosis: Chronic left shoulder pain  Chronic right shoulder pain  Chronic bilateral low back pain, unspecified whether sciatica present     Problem List Patient Active Problem List    Diagnosis Date Noted  . Diverticulosis of colon without hemorrhage   . Family history of colon cancer - brother 02/11/2015  . History of adenomatous polyp of colon 07/30/2014   PHYSICAL THERAPY DISCHARGE SUMMARY  Visits from Start of Care: 4  Current functional level related to goals / functional outcomes: See above for current status. Pt will discharge to HEP today.     Remaining deficits: Bil shoulder pain, strength and postural deficits. 20% improvement and continued improvement expected with HEP compliance   Education / Equipment: HEP, posture/body mechanics  Plan: Patient agrees to discharge.  Patient goals were partially met. Patient is being discharged due to being pleased with the current functional level.  ?????          Sigurd Sos, PT 05/29/20 8:35 AM  [facility] [address], [place], [place], [address] Phone: [phone]   Fax:  [phone]  Name: JOREN REHM MRN: [id number] Date of Birth: October 18, 1950

## 2020-07-15 MED ORDER — CLONIDINE HCL 0.2 MG PO TABS: 0.2000 mg | ORAL_TABLET | Freq: Two times a day (BID) | ORAL | 3 refills | Status: DC

## 2020-07-22 ENCOUNTER — Other Ambulatory Visit: Payer: Self-pay | Admitting: Cardiology

## 2020-07-22 DIAGNOSIS — R072 Precordial pain: Secondary | ICD-10-CM

## 2020-08-01 ENCOUNTER — Telehealth: Payer: Self-pay | Admitting: Cardiology

## 2020-08-01 NOTE — Telephone Encounter (Signed)
Levada Dy with [facility], Dr. Fayrene Fearing office, states the patient they saw the patient today and he reports he has been experiencing left arm pain that radiates to the left side of his chest and shoulder. She states he also becomes SOB when he has the left-sided pain. Per Levada Dy, these symptoms have been going on for about 1 week. Levada Dy states they ran an EKG and they faxed it to the [place] office. Please return call and ask for [facility].   Pt c/o of Chest Pain: STAT if CP now or developed within 24 hours  1. Are you having CP right now? No   2. Are you experiencing any other symptoms (ex. SOB, nausea, vomiting, sweating)? Per Levada Dy, patient has SOB with the CP  3. How long have you been experiencing CP?  About 1 week   4. Is your CP continuous or coming and going? Coming and going   5. Have you taken Nitroglycerin? No    Pt c/o Shortness Of Breath: STAT if SOB developed within the last 24 hours or pt is noticeably SOB on the phone  1. Are you currently SOB (can you hear that pt is SOB on the phone)? No, SOB only occurs when the chest pain  2. How long have you been experiencing SOB? About 1 week  3. Are you SOB when sitting or when up moving around? SOB occurs whenever patient has chest pain  4. Are you currently experiencing any other symptoms? No  ?

## 2020-08-01 NOTE — Telephone Encounter (Signed)
Spoke with pt, about 3-4 weeks ago he developed a bad sinus congestion and since that time he has had some SOB. He also reports he has had shoulder pain for some time and it will occasionally will come around to the front of his chest and down his left arm. He can reproduce the pain with movement but can also get the discomfort and SOB and it will immediately go aware with rest. ECG from PCP reviewed by Dr Christopher(DOD), there are changes compared to the last one but may also be lead position. The patient is pain free at this time. Explained to the patient due to his abnormal CTA and the change in the ECG would like for him to be seen. Follow up scheduled with dr Stanford Breed next week. Patient voiced understanding of ER precautions.

## 2020-08-01 NOTE — Telephone Encounter (Signed)
Patient was returning call 

## 2020-08-01 NOTE — Telephone Encounter (Signed)
Spoke with angela, correct fax number given for ECG to be re-faxed. She reports the patient has had shoulder pain for some time and it will radiate into his chest and arm. Left message for pt to call

## 2020-08-04 NOTE — Progress Notes (Signed)
HPI: FU CAD. Also with h/o of diabetes mellitus, hypertension and hyperlipidemia.  Abdominal ultrasound January 2019 showed no aneurysm. CTA November 14, 1999 showed nonobstructive disease other than 50 to 69% stenosis in the mid LAD. Calcium score 286.  FFR 0.77.  Patient recently complained of chest pain and was added to my schedule this morning.  Since last seen patient states he had a recent URI/allergies.  He was seen by primary care and described pain in his left shoulder.  The pain increases with activities such as using his left arm to drive or any physical activity with his left arm.  It improves when he lifts his arm.  He does have dyspnea on exertion but no orthopnea, PND or pedal edema.  He was therefore scheduled to see me.  Current Outpatient Medications  Medication Sig Dispense Refill   aspirin EC 81 MG tablet Take 1 tablet (81 mg total) by mouth daily. Swallow whole. 90 tablet 3   Blood Glucose Monitoring Suppl (ONE TOUCH ULTRA 2) w/Device KIT USE AS DIRECTED     cloNIDine (CATAPRES) 0.2 MG tablet Take 1 tablet (0.2 mg total) by mouth 2 (two) times daily. 180 tablet 3   cyanocobalamin 1000 MCG tablet Take by mouth.     diltiazem (CARDIZEM CD) 300 MG 24 hr capsule TAKE ONE CAPSULE (300 MG TOTAL) BY MOUTH DAILY.     FLUoxetine (PROZAC) 20 MG capsule Take 20 mg by mouth daily.     glucose blood test strip USE AS DIRECTED ONCE DAILY     metFORMIN (GLUCOPHAGE-XR) 750 MG 24 hr tablet TAKE ONE TABLET (750 MG DOSE) BY MOUTH WITH BREAKFAST.     methylPREDNISolone (MEDROL DOSEPAK) 4 MG TBPK tablet Follow package directions - Take 6 tablets on day 1 and decrease by 1 tablet daily 6/5/4/3/2/1     Omega-3 Fatty Acids (FISH OIL) 1000 MG CAPS Take 2,000 mg by mouth daily.      omeprazole (PRILOSEC) 40 MG capsule TAKE ONE CAPSULE (40 MG DOSE) BY MOUTH DAILY.     OneTouch Delica Lancets 31Y MISC TEST DAILY     rosuvastatin (CRESTOR) 40 MG tablet TAKE 1 TABLET BY MOUTH EVERY DAY 90 tablet 3    valsartan-hydrochlorothiazide (DIOVAN-HCT) 320-25 MG tablet Take 1 tablet by mouth daily.     No current facility-administered medications for this visit.     Past Medical History:  Diagnosis Date   Allergy    Pollen - no meds    Anxiety    Arthritis    left shoulder, neck, back    Cataract    removed both eyes    Diabetes mellitus without complication ([facility])    On Metformin    GERD (gastroesophageal reflux disease)    History of adenomatous polyp of colon 07/30/2014   Hypertension    Polyp of colon     Past Surgical History:  Procedure Laterality Date   CATARACT EXTRACTION Bilateral    COLONOSCOPY     COLONOSCOPY N/A 02/11/2015   Procedure: COLONOSCOPY;  Surgeon: Gatha Mayer, MD;  [facility];  Service: Endoscopy;  Laterality: N/A;   COLONOSCOPY WITH PROPOFOL N/A 10/07/2014   Procedure: COLONOSCOPY WITH PROPOFOL;  Surgeon: Inda Castle, MD;  [facility]: [facility];  Service: Endoscopy;  Laterality: N/A;   COLONOSCOPY WITH PROPOFOL N/A 11/04/2015   Procedure: COLONOSCOPY WITH PROPOFOL;  Surgeon: Gatha Mayer, MD;  [facility];  Service: Endoscopy;  Laterality: N/A;   HOT HEMOSTASIS  N/A 10/07/2014   Procedure: HOT HEMOSTASIS (ARGON PLASMA COAGULATION/BICAP);  Surgeon: Inda Castle, MD;  Location: [facility];  Service: Endoscopy;  Laterality: N/A;   HOT HEMOSTASIS N/A 02/11/2015   Procedure: HOT HEMOSTASIS (ARGON PLASMA COAGULATION/BICAP);  Surgeon: Gatha Mayer, MD;  Location: [facility];  Service: Endoscopy;  Laterality: N/A;   POLYPECTOMY     right foot     SHOULDER ARTHROSCOPY W/ ROTATOR CUFF REPAIR     both shoulders    Social History   Socioeconomic History   Marital status: Married    Spouse name: Not on file   Number of children: 2   Years of education: Not on file   Highest education level: Not on file  Occupational History   Not on file  Tobacco Use   Smoking status: Former    Pack years: 0.00    Types: Cigarettes     Quit date: 10/19/1980    Years since quitting: 39.8   Smokeless tobacco: Never  Substance and Sexual Activity   Alcohol use: Yes    Alcohol/week: 1.0 standard drink    Types: 1 Cans of beer per week    Comment: occasionally   Drug use: No   Sexual activity: Not on file  Other Topics Concern   Not on file  Social History Narrative   Not on file   Social Determinants of Health   Financial Resource Strain: Not on file  Food Insecurity: Not on file  Transportation Needs: Not on file  Physical Activity: Not on file  Stress: Not on file  Social Connections: Not on file  Intimate Partner Violence: Not on file    Family History  Problem Relation Age of Onset   Colon cancer Brother    Colon polyps Brother    Esophageal cancer Neg Hx    Rectal cancer Neg Hx    Stomach cancer Neg Hx     ROS: no fevers or chills, productive cough, hemoptysis, dysphasia, odynophagia, melena, hematochezia, dysuria, hematuria, rash, seizure activity, orthopnea, PND, pedal edema, claudication. Remaining systems are negative.  Physical Exam: Well-developed well-nourished in no acute distress.  Skin is warm and dry.  HEENT is normal.  Neck is supple.  Chest is clear to auscultation with normal expansion.  Cardiovascular exam is regular rate and rhythm.  Abdominal exam nontender or distended. No masses palpated. Extremities show no edema. neuro grossly intact  ECG-08/01/20 sinus rhythm, cannot rule out prior inferior infarct, no ST changes.  Personally reviewed  A/P  1 chest pain-patient describes left shoulder pain that sounds musculoskeletal in etiology.  It increases with using his left upper extremity with any type of work or using his left arm to drive.  He does note that he has some discomfort when he walks long distances with his arm hanging down but improves with lifting his arm.  His electrocardiogram from May 10 was personally reviewed and showed no new ST changes.  Prior inferior infarct  cannot be excluded.  I will arrange a [place] nuclear study for risk stratification.  If no ischemia I have asked him to increase his activities including exercise.  2 coronary artery disease-moderate disease noted on previous CTA in the mid LAD.  FFR abnormal but given no symptoms he was treated medically.  Continue aspirin and statin.  3 hypertension-blood pressure controlled.  Continue present medications and follow.  4 hyperlipidemia-continue statin.  5 obesity-we again discussed the importance of weight loss.  Kirk Ruths, MD

## 2020-08-05 ENCOUNTER — Encounter: Payer: Self-pay | Admitting: Cardiology

## 2020-08-05 ENCOUNTER — Ambulatory Visit: Payer: Medicare HMO | Admitting: Cardiology

## 2020-08-05 ENCOUNTER — Other Ambulatory Visit: Payer: Self-pay

## 2020-08-05 VITALS — BP 136/70 | HR 60 | Ht 72.0 in | Wt 276.6 lb

## 2020-08-05 DIAGNOSIS — I2510 Atherosclerotic heart disease of native coronary artery without angina pectoris: Secondary | ICD-10-CM | POA: Diagnosis not present

## 2020-08-05 DIAGNOSIS — R072 Precordial pain: Secondary | ICD-10-CM | POA: Diagnosis not present

## 2020-08-05 DIAGNOSIS — I10 Essential (primary) hypertension: Secondary | ICD-10-CM

## 2020-08-05 DIAGNOSIS — E7800 Pure hypercholesterolemia, unspecified: Secondary | ICD-10-CM

## 2020-08-05 NOTE — Patient Instructions (Signed)
  Testing/Procedures:  Your physician has requested that you have a lexiscan myoview. For further information please visit [email or web address]. Please follow instruction sheet, as given.[address]     Follow-Up: At [facility], you and your health needs are our priority.  As part of our continuing mission to provide you with exceptional heart care, we have created designated Provider Care Teams.  These Care Teams include your primary Cardiologist (physician) and Advanced Practice Providers (APPs -  Physician Assistants and Nurse Practitioners) who all work together to provide you with the care you need, when you need it.  We recommend signing up for the patient portal called "MyChart".  Sign up information is provided on this After Visit Summary.  MyChart is used to connect with patients for Virtual Visits (Telemedicine).  Patients are able to view lab/test results, encounter notes, upcoming appointments, etc.  Non-urgent messages can be sent to your provider as well.   To learn more about what you can do with MyChart, go to [email or web address].    Your next appointment:   6 month(s)  The format for your next appointment:   In Person  Provider:   Brian Crenshaw, MD   

## 2020-08-14 ENCOUNTER — Telehealth (HOSPITAL_COMMUNITY): Payer: Self-pay | Admitting: *Deleted

## 2020-08-14 NOTE — Telephone Encounter (Signed)
Patient given detailed instructions per Myocardial Perfusion Study Information Sheet for the test on 08/19/20 at 1:00. Patient notified to arrive 15 minutes early and that it is imperative to arrive on time for appointment to keep from having the test rescheduled.  If you need to cancel or reschedule your appointment, please call the office within 24 hours of your appointment. . Patient verbalized understanding.Paul Hicks

## 2020-08-14 NOTE — Telephone Encounter (Signed)
Left message on voicemail per DPR in reference to upcoming appointment scheduled on 08/20/20 with detailed instructions given per Myocardial Perfusion Study Information Sheet for the test. LM to arrive 15 minutes early, and that it is imperative to arrive on time for appointment to keep from having the test rescheduled. If you need to cancel or reschedule your appointment, please call the office within 24 hours of your appointment. Failure to do so may result in a cancellation of your appointment, and a $50 no show fee. Phone number given for call back for any questions. Kirstie Peri

## 2020-08-20 ENCOUNTER — Ambulatory Visit (HOSPITAL_COMMUNITY): Payer: Medicare HMO | Attending: Cardiovascular Disease

## 2020-08-20 ENCOUNTER — Other Ambulatory Visit: Payer: Self-pay

## 2020-08-20 DIAGNOSIS — R072 Precordial pain: Secondary | ICD-10-CM | POA: Diagnosis present

## 2020-08-20 MED ORDER — REGADENOSON 0.4 MG/5ML IV SOLN
0.4000 mg | Freq: Once | INTRAVENOUS | Status: AC
  Administered 2020-08-20: 0.4 mg via INTRAVENOUS

## 2020-08-20 MED ORDER — TECHNETIUM TC 99M TETROFOSMIN IV KIT
30.6000 | PACK | Freq: Once | INTRAVENOUS | Status: AC | PRN
  Administered 2020-08-20: 30.6 via INTRAVENOUS
  Filled 2020-08-20: qty 31

## 2020-08-21 ENCOUNTER — Encounter (HOSPITAL_COMMUNITY): Payer: Medicare HMO | Attending: Cardiology

## 2020-08-21 LAB — MYOCARDIAL PERFUSION IMAGING
LV dias vol: 107 mL (ref 62–150)
LV sys vol: 40 mL
Peak HR: 83 {beats}/min
Rest HR: 63 {beats}/min
SDS: 0
SRS: 0
SSS: 0
TID: 1.17

## 2020-08-21 MED ORDER — TECHNETIUM TC 99M TETROFOSMIN IV KIT
29.7000 | PACK | Freq: Once | INTRAVENOUS | Status: AC | PRN
  Administered 2020-08-21: 29.7 via INTRAVENOUS
  Filled 2020-08-21: qty 30

## 2020-08-28 ENCOUNTER — Encounter: Payer: Self-pay | Admitting: Orthopedic Surgery

## 2020-08-28 ENCOUNTER — Ambulatory Visit: Payer: Medicare HMO | Admitting: Physician Assistant

## 2020-08-28 ENCOUNTER — Ambulatory Visit (INDEPENDENT_AMBULATORY_CARE_PROVIDER_SITE_OTHER): Payer: Medicare HMO

## 2020-08-28 DIAGNOSIS — G8929 Other chronic pain: Secondary | ICD-10-CM

## 2020-08-28 DIAGNOSIS — M25512 Pain in left shoulder: Secondary | ICD-10-CM

## 2020-08-28 MED ORDER — LIDOCAINE HCL 1 % IJ SOLN
5.0000 mL | INTRAMUSCULAR | Status: AC | PRN
  Administered 2020-08-28: 5 mL

## 2020-08-28 MED ORDER — METHYLPREDNISOLONE ACETATE 40 MG/ML IJ SUSP
40.0000 mg | INTRAMUSCULAR | Status: AC | PRN
  Administered 2020-08-28: 40 mg via INTRA_ARTICULAR

## 2020-08-28 NOTE — Progress Notes (Signed)
Office Visit Note   Patient: Paul Hicks           Date of Birth: 08/07/1950           MRN: [id number] Visit Date: 08/28/2020              Requested by: Chesley Noon, MD [address] [place],  [place] [address] PCP: Chesley Noon, MD  Chief Complaint  Patient presents with   Left Shoulder - Pain      HPI: Patient presents today for follow-up on his left shoulder.  He has a history of left shoulder pain and had a left shoulder arthroscopy close to 20 years ago which she did find helpful.  At his last visit with Dr. Sharol Given it was suggested that he try physical therapy.  He also really does not think he got much relief.  He describes the pain as achy and mostly around the shoulder.  Sometimes it radiates down his arm.  It was radiating into his chest but he had a thorough cardiac evaluation and his cardiologist does not think the pain is from a cardiac cause  Assessment & Plan: Visit Diagnoses:  1. Chronic left shoulder pain     Plan: We will go forward with an injection today follow-up in 3 weeks  Follow-Up Instructions: No follow-ups on file.   Ortho Exam  Patient is alert, oriented, no adenopathy, well-dressed, normal affect, normal respiratory effort. Left shoulder he has fairly good forward elevation internal rotation behind the back with some pain resisted abduction is slightly painful.  No clinical abnormalities  Imaging: No results found. No images are attached to the encounter.  Labs: No results found for: HGBA1C, ESRSEDRATE, CRP, LABURIC, REPTSTATUS, GRAMSTAIN, CULT, LABORGA   Lab Results  Component Value Date   ALBUMIN 4.5 02/04/2020   ALBUMIN 4.5 11/08/2019   ALBUMIN 4.0 01/19/2010    No results found for: MG No results found for: VD25OH  No results found for: PREALBUMIN CBC EXTENDED Latest Ref Rng & Units 01/19/2010  WBC 4.0 - 10.5 K/uL 9.9  RBC 4.22 - 5.81 MIL/uL 6.12(H)  HGB 13.0 - 17.0 g/dL 17.5(H)  HCT 39.0 - 52.0 % 51.6  PLT 150 -  400 K/uL 216  NEUTROABS 1.7 - 7.7 K/uL 8.4(H)  LYMPHSABS 0.7 - 4.0 K/uL 0.8     There is no height or weight on file to calculate BMI.  Orders:  Orders Placed This Encounter  Procedures   XR Shoulder Left   No orders of the defined types were placed in this encounter.    Procedures: Large Joint Inj: L subacromial bursa on 08/28/2020 11:12 AM Indications: diagnostic evaluation and pain Details: 22 G 1.5 in needle, posterior approach  Arthrogram: No  Medications: 5 mL lidocaine 1 %; 40 mg methylPREDNISolone acetate 40 MG/ML Outcome: tolerated well, no immediate complications Procedure, treatment alternatives, risks and benefits explained, specific risks discussed. Consent was given by the patient.     Clinical Data: No additional findings.  ROS:  All other systems negative, except as noted in the HPI. Review of Systems  Objective: Vital Signs: There were no vitals taken for this visit.  Specialty Comments:  No specialty comments available.  PMFS History: Patient Active Problem List   Diagnosis Date Noted   Diverticulosis of colon without hemorrhage    Family history of colon cancer - brother 02/11/2015   History of adenomatous polyp of colon 07/30/2014   Past Medical History:  Diagnosis Date  Allergy    Pollen - no meds    Anxiety    Arthritis    left shoulder, neck, back    Cataract    removed both eyes    Diabetes mellitus without complication (HCC)    On Metformin    GERD (gastroesophageal reflux disease)    History of adenomatous polyp of colon 07/30/2014   Hypertension    Polyp of colon     Family History  Problem Relation Age of Onset   Colon cancer Brother    Colon polyps Brother    Esophageal cancer Neg Hx    Rectal cancer Neg Hx    Stomach cancer Neg Hx     Past Surgical History:  Procedure Laterality Date   CATARACT EXTRACTION Bilateral    COLONOSCOPY     COLONOSCOPY N/A 02/11/2015   Procedure: COLONOSCOPY;  Surgeon: Gatha Mayer,  MD;  Location: [facility];  Service: Endoscopy;  Laterality: N/A;   COLONOSCOPY WITH PROPOFOL N/A 10/07/2014   Procedure: COLONOSCOPY WITH PROPOFOL;  Surgeon: Inda Castle, MD;  Location: [facility];  Service: Endoscopy;  Laterality: N/A;   COLONOSCOPY WITH PROPOFOL N/A 11/04/2015   Procedure: COLONOSCOPY WITH PROPOFOL;  Surgeon: Gatha Mayer, MD;  Location: [facility];  Service: Endoscopy;  Laterality: N/A;   HOT HEMOSTASIS N/A 10/07/2014   Procedure: HOT HEMOSTASIS (ARGON PLASMA COAGULATION/BICAP);  Surgeon: Inda Castle, MD;  Location: [facility];  Service: Endoscopy;  Laterality: N/A;   HOT HEMOSTASIS N/A 02/11/2015   Procedure: HOT HEMOSTASIS (ARGON PLASMA COAGULATION/BICAP);  Surgeon: Gatha Mayer, MD;  Location: [facility];  Service: Endoscopy;  Laterality: N/A;   POLYPECTOMY     right foot     SHOULDER ARTHROSCOPY W/ ROTATOR CUFF REPAIR     both shoulders   Social History   Occupational History   Not on file  Tobacco Use   Smoking status: Former    Pack years: 0.00    Types: Cigarettes    Quit date: 10/19/1980    Years since quitting: 39.8   Smokeless tobacco: Never  Substance and Sexual Activity   Alcohol use: Yes    Alcohol/week: 1.0 standard drink    Types: 1 Cans of beer per week    Comment: occasionally   Drug use: No   Sexual activity: Not on file

## 2020-09-18 ENCOUNTER — Encounter: Payer: Self-pay | Admitting: Orthopedic Surgery

## 2020-09-18 ENCOUNTER — Ambulatory Visit: Payer: Medicare HMO | Admitting: Orthopedic Surgery

## 2020-09-18 DIAGNOSIS — G8929 Other chronic pain: Secondary | ICD-10-CM | POA: Diagnosis not present

## 2020-09-18 DIAGNOSIS — M75112 Incomplete rotator cuff tear or rupture of left shoulder, not specified as traumatic: Secondary | ICD-10-CM

## 2020-09-18 DIAGNOSIS — M25512 Pain in left shoulder: Secondary | ICD-10-CM | POA: Diagnosis not present

## 2020-09-18 NOTE — Progress Notes (Signed)
Office Visit Note   Patient: Paul Hicks           Date of Birth: 1950-08-31           MRN: [id number] Visit Date: 09/18/2020              Requested by: Chesley Noon, MD [address] [place],  [place] [address] PCP: Chesley Noon, MD  Chief Complaint  Patient presents with   Left Shoulder - Follow-up    S/p injection 08/28/20      HPI: Patient is a 70 year old gentleman who presents with chronic bilateral shoulder pain left worse than right.  Patient has had no relief with injections he has also undergone physical therapy without relief.  Patient has pain with abduction and flexion.  Assessment & Plan: Visit Diagnoses:  1. Chronic left shoulder pain   2. Nontraumatic incomplete tear of left rotator cuff     Plan: Due to failure of conservative therapy including injections and therapy we will request an MRI scan of the left shoulder anticipate arthroscopic treatment of a chronic rotator cuff tear.  Follow-Up Instructions: Return if symptoms worsen or fail to improve.   Ortho Exam  Patient is alert, oriented, no adenopathy, well-dressed, normal affect, normal respiratory effort. Examination patient has abduction and flexion to 90 degrees of both shoulders he has pain with Neer and Hawkins impingement test on the left he has pain to palpation of the biceps tendon.  Patient states he has good relief with using the sole orthotics in his shoes.  Imaging: No results found. No images are attached to the encounter.  Labs: No results found for: HGBA1C, ESRSEDRATE, CRP, LABURIC, REPTSTATUS, GRAMSTAIN, CULT, LABORGA   Lab Results  Component Value Date   ALBUMIN 4.5 02/04/2020   ALBUMIN 4.5 11/08/2019   ALBUMIN 4.0 01/19/2010    No results found for: MG No results found for: VD25OH  No results found for: PREALBUMIN CBC EXTENDED Latest Ref Rng & Units 01/19/2010  WBC 4.0 - 10.5 K/uL 9.9  RBC 4.22 - 5.81 MIL/uL 6.12(H)  HGB 13.0 - 17.0 g/dL 17.5(H)  HCT 39.0  - 52.0 % 51.6  PLT 150 - 400 K/uL 216  NEUTROABS 1.7 - 7.7 K/uL 8.4(H)  LYMPHSABS 0.7 - 4.0 K/uL 0.8     There is no height or weight on file to calculate BMI.  Orders:  No orders of the defined types were placed in this encounter.  No orders of the defined types were placed in this encounter.    Procedures: No procedures performed  Clinical Data: No additional findings.  ROS:  All other systems negative, except as noted in the HPI. Review of Systems  Objective: Vital Signs: There were no vitals taken for this visit.  Specialty Comments:  No specialty comments available.  PMFS History: Patient Active Problem List   Diagnosis Date Noted   Diverticulosis of colon without hemorrhage    Family history of colon cancer - brother 02/11/2015   History of adenomatous polyp of colon 07/30/2014   Past Medical History:  Diagnosis Date   Allergy    Pollen - no meds    Anxiety    Arthritis    left shoulder, neck, back    Cataract    removed both eyes    Diabetes mellitus without complication ([facility])    On Metformin    GERD (gastroesophageal reflux disease)    History of adenomatous polyp of colon 07/30/2014   Hypertension  Polyp of colon     Family History  Problem Relation Age of Onset   Colon cancer Brother    Colon polyps Brother    Esophageal cancer Neg Hx    Rectal cancer Neg Hx    Stomach cancer Neg Hx     Past Surgical History:  Procedure Laterality Date   CATARACT EXTRACTION Bilateral    COLONOSCOPY     COLONOSCOPY N/A 02/11/2015   Procedure: COLONOSCOPY;  Surgeon: Gatha Mayer, MD;  Location: [facility];  Service: Endoscopy;  Laterality: N/A;   COLONOSCOPY WITH PROPOFOL N/A 10/07/2014   Procedure: COLONOSCOPY WITH PROPOFOL;  Surgeon: Inda Castle, MD;  Location: [facility];  Service: Endoscopy;  Laterality: N/A;   COLONOSCOPY WITH PROPOFOL N/A 11/04/2015   Procedure: COLONOSCOPY WITH PROPOFOL;  Surgeon: Gatha Mayer, MD;  Location: [facility];  Service: Endoscopy;  Laterality: N/A;   HOT HEMOSTASIS N/A 10/07/2014   Procedure: HOT HEMOSTASIS (ARGON PLASMA COAGULATION/BICAP);  Surgeon: Inda Castle, MD;  Location: [facility];  Service: Endoscopy;  Laterality: N/A;   HOT HEMOSTASIS N/A 02/11/2015   Procedure: HOT HEMOSTASIS (ARGON PLASMA COAGULATION/BICAP);  Surgeon: Gatha Mayer, MD;  Location: [facility];  Service: Endoscopy;  Laterality: N/A;   POLYPECTOMY     right foot     SHOULDER ARTHROSCOPY W/ ROTATOR CUFF REPAIR     both shoulders   Social History   Occupational History   Not on file  Tobacco Use   Smoking status: Former    Types: Cigarettes    Quit date: 10/19/1980    Years since quitting: 39.9   Smokeless tobacco: Never  Substance and Sexual Activity   Alcohol use: Yes    Alcohol/week: 1.0 standard drink    Types: 1 Cans of beer per week    Comment: occasionally   Drug use: No   Sexual activity: Not on file

## 2020-10-02 ENCOUNTER — Encounter: Payer: Self-pay | Admitting: Physician Assistant

## 2020-10-02 ENCOUNTER — Ambulatory Visit: Payer: Medicare HMO | Admitting: Physician Assistant

## 2020-10-02 VITALS — BP 140/80 | HR 69 | Ht 72.0 in | Wt 274.0 lb

## 2020-10-02 DIAGNOSIS — K219 Gastro-esophageal reflux disease without esophagitis: Secondary | ICD-10-CM

## 2020-10-02 DIAGNOSIS — D509 Iron deficiency anemia, unspecified: Secondary | ICD-10-CM

## 2020-10-02 HISTORY — PX: ESOPHAGOGASTRODUODENOSCOPY: SHX1529

## 2020-10-02 NOTE — Progress Notes (Signed)
Chief Complaint: IDA  HPI:    Paul Hicks is a 70 year old Caucasian male with a past medical history as listed below including reflux, known to Dr. Carlean Purl, who was referred to me by Chesley Noon, MD for a complaint of iron deficiency anemia.    10/24/2018 colonoscopy done for personal history of adenomatous polyps 3 years prior and a family history of colon cancer in his brother with a finding of 2 diminutive polyps in the transverse colon, severe diverticulosis in the sigmoid colon with narrowing of the colon associated with the diverticular opening, it was noted he had a personal history of colonic polyps 07/2014 2 removed and large cecal found, 10/12/2014 and 02/11/2015 large cecal adenoma removed/ablated, 10/2015 cecal polyp confirmed eradication diminutive sigmoid adenoma removed.  Pathology showed 2 diminutive adenomas and recall was placed for 2025.    09/23/2020 hemoglobin 10.9 (02/27/2019 hemoglobin 15.3).    Today, the patient tells me that he has been giving blood every couple of months as much as he is allowed for many years.  He has never had a problem with this and in fact about 86 or so days ago went to give blood and did fine, but then he went back about 2 to 3 weeks ago and was told that his iron was too low and that he could not give any blood.  He called his PCP and followed with them and was found to have a hemoglobin of 10.9.  Apparently had Hemoccult stool testing which was negative per him and he was told that his iron was still low.  (We do not have iron studies today).  Describes that they told him to come here for an endoscopy.    Does describe chronic reflux symptoms which are mostly controlled with Omeprazole 40 mg daily and less "I eat completely the wrong thing", at times he requires Mylanta for breakthrough but again only with the wrong diet.      Also describes a left-sided abdominal pain but also describes 3 falls on that same side of his body over the past 8 months, the last  only a few weeks ago all of which he hit this side.  All were accidental, tripping over various objects.    Denies fever, chills, weight loss, blood in his stool, black stool, change in bowel habits, nausea, vomiting or symptoms that awaken him from sleep.  Past Medical History:  Diagnosis Date   Allergy    Pollen - no meds    Anxiety    Arthritis    left shoulder, neck, back    Cataract    removed both eyes    Diabetes mellitus without complication (HCC)    On Metformin    GERD (gastroesophageal reflux disease)    History of adenomatous polyp of colon 07/30/2014   Hypertension    Polyp of colon     Past Surgical History:  Procedure Laterality Date   CATARACT EXTRACTION Bilateral    COLONOSCOPY     COLONOSCOPY N/A 02/11/2015   Procedure: COLONOSCOPY;  Surgeon: Gatha Mayer, MD;  Location: [facility];  Service: Endoscopy;  Laterality: N/A;   COLONOSCOPY WITH PROPOFOL N/A 10/07/2014   Procedure: COLONOSCOPY WITH PROPOFOL;  Surgeon: Inda Castle, MD;  Location: [facility];  Service: Endoscopy;  Laterality: N/A;   COLONOSCOPY WITH PROPOFOL N/A 11/04/2015   Procedure: COLONOSCOPY WITH PROPOFOL;  Surgeon: Gatha Mayer, MD;  Location: [facility];  Service: Endoscopy;  Laterality: N/A;   HOT HEMOSTASIS  N/A 10/07/2014   Procedure: HOT HEMOSTASIS (ARGON PLASMA COAGULATION/BICAP);  Surgeon: Inda Castle, MD;  Location: [facility];  Service: Endoscopy;  Laterality: N/A;   HOT HEMOSTASIS N/A 02/11/2015   Procedure: HOT HEMOSTASIS (ARGON PLASMA COAGULATION/BICAP);  Surgeon: Gatha Mayer, MD;  Location: [facility];  Service: Endoscopy;  Laterality: N/A;   POLYPECTOMY     right foot     SHOULDER ARTHROSCOPY W/ ROTATOR CUFF REPAIR     both shoulders    Current Outpatient Medications  Medication Sig Dispense Refill   aspirin EC 81 MG tablet Take 1 tablet (81 mg total) by mouth daily. Swallow whole. 90 tablet 3   Blood Glucose Monitoring Suppl (ONE TOUCH ULTRA 2) w/Device KIT  USE AS DIRECTED     cloNIDine (CATAPRES) 0.2 MG tablet Take 1 tablet (0.2 mg total) by mouth 2 (two) times daily. 180 tablet 3   cyanocobalamin 1000 MCG tablet Take by mouth.     diltiazem (CARDIZEM CD) 300 MG 24 hr capsule TAKE ONE CAPSULE (300 MG TOTAL) BY MOUTH DAILY.     FLUoxetine (PROZAC) 20 MG capsule Take 20 mg by mouth daily.     glucose blood test strip USE AS DIRECTED ONCE DAILY     metFORMIN (GLUCOPHAGE-XR) 750 MG 24 hr tablet TAKE ONE TABLET (750 MG DOSE) BY MOUTH WITH BREAKFAST.     Omega-3 Fatty Acids (FISH OIL) 1000 MG CAPS Take 2,000 mg by mouth daily.      omeprazole (PRILOSEC) 40 MG capsule TAKE ONE CAPSULE (40 MG DOSE) BY MOUTH DAILY.     OneTouch Delica Lancets 88C MISC TEST DAILY     rosuvastatin (CRESTOR) 40 MG tablet TAKE 1 TABLET BY MOUTH EVERY DAY 90 tablet 3   valsartan-hydrochlorothiazide (DIOVAN-HCT) 320-25 MG tablet Take 1 tablet by mouth daily.     methylPREDNISolone (MEDROL DOSEPAK) 4 MG TBPK tablet Follow package directions - Take 6 tablets on day 1 and decrease by 1 tablet daily 6/5/4/3/2/1     No current facility-administered medications for this visit.    Allergies as of 10/02/2020   (No Known Allergies)    Family History  Problem Relation Age of Onset   Colon cancer Brother    Colon polyps Brother    Esophageal cancer Neg Hx    Rectal cancer Neg Hx    Stomach cancer Neg Hx     Social History   Socioeconomic History   Marital status: Married    Spouse name: Not on file   Number of children: 2   Years of education: Not on file   Highest education level: Not on file  Occupational History   Not on file  Tobacco Use   Smoking status: Former    Types: Cigarettes    Quit date: 10/19/1980    Years since quitting: 39.9   Smokeless tobacco: Never  Vaping Use   Vaping Use: Never used  Substance and Sexual Activity   Alcohol use: Yes    Alcohol/week: 1.0 standard drink    Types: 1 Cans of beer per week    Comment: occasionally   Drug use:  No   Sexual activity: Not on file  Other Topics Concern   Not on file  Social History Narrative   Not on file   Social Determinants of Health   Financial Resource Strain: Not on file  Food Insecurity: Not on file  Transportation Needs: Not on file  Physical Activity: Not on file  Stress: Not on file  Social Connections: Not on file  Intimate Partner Violence: Not on file    Review of Systems:    Constitutional: No weight loss, fever or chills Cardiovascular: No chest pain Respiratory: No SOB Gastrointestinal: See HPI and otherwise negative   Physical Exam:  Vital signs: BP 140/80   Pulse 69   Ht 6' (1.829 m)   Wt 274 lb (124.3 kg)   SpO2 98%   BMI 37.16 kg/m   Constitutional:   Pleasant Caucasian male appears to be in NAD, Well developed, Well nourished, alert and cooperative Respiratory: Respirations even and unlabored. Lungs clear to auscultation bilaterally.   No wheezes, crackles, or rhonchi.  Cardiovascular: Normal S1, S2. No MRG. Regular rate and rhythm. No peripheral edema, cyanosis or pallor.  Gastrointestinal:  Soft, nondistended, mild TTP on left side of abdomen to only light palpation no rebound or guarding. Normal bowel sounds. No appreciable masses or hepatomegaly. Rectal:  Not performed.  Psychiatric: Demonstrates good judgement and reason without abnormal affect or behaviors.  See HPI for most recent labs.   Assessment: 1.  IDA: Found when patient went to give blood, he does this frequently but has never had an issue before, hemoglobin at 10.8, previously around 16 a year ago, Hemoccult negative per patient, does have history of reflux, recent colonoscopy 2 years ago with 2 diminutive polyp adenomas and otherwise normal, family history of colon cancer in brother; consider upper GI source versus frequency of blood donating versus other  Plan: 1.  Requesting recent iron studies and Hemoccult study from PCP. 2.  Martin Majestic ahead and scheduled patient for  diagnostic EGD given IDA and recent colonoscopy only 2 years prior.  This is scheduled with Dr. Carlean Purl in the [facility].  Did provide the patient a detailed list of risks for the procedure and he agrees to proceed.  Did explain that if Dr. Carlean Purl does not find anything he may want the patient to have repeat colon given his family history of colon cancer and his personal history of polyps.  Patient would like to start with EGD. 3.  Continue Omeprazole 40 mg daily and Mylanta for breakthrough 4.  Patient may benefit from starting oral iron supplementation, we will wait until we get results from PCP recently. 5.  Patient to follow in clinic per recommendations from Dr. Carlean Purl after time of procedure.  Ellouise Newer, PA-C [facility] 10/02/2020, 9:30 AM  Cc: Chesley Noon, MD

## 2020-10-02 NOTE — Patient Instructions (Signed)
You have been scheduled for an endoscopy. Please follow written instructions given to you at your visit today. If you use inhalers (even only as needed), please bring them with you on the day of your procedure.  If you are age 72 or older, your body mass index should be between 23-30. Your Body mass index is 37.16 kg/m. If this is out of the aforementioned range listed, please consider follow up with your Primary Care Provider.  If you are age 63 or younger, your body mass index should be between 19-25. Your Body mass index is 37.16 kg/m. If this is out of the aformentioned range listed, please consider follow up with your Primary Care Provider.   __________________________________________________________  The [facility] providers would like to encourage you to use [facility] to communicate with providers for non-urgent requests or questions.  Due to long hold times on the telephone, sending your provider a message by [facility] may be a faster and more efficient way to get a response.  Please allow 48 business hours for a response.  Please remember that this is for non-urgent requests.

## 2020-10-03 ENCOUNTER — Ambulatory Visit (AMBULATORY_SURGERY_CENTER): Payer: Medicare HMO | Admitting: Internal Medicine

## 2020-10-03 ENCOUNTER — Encounter: Payer: Self-pay | Admitting: Internal Medicine

## 2020-10-03 ENCOUNTER — Other Ambulatory Visit: Payer: Self-pay

## 2020-10-03 VITALS — BP 129/77 | HR 63 | Temp 97.1°F | Resp 15 | Ht 73.0 in | Wt 274.0 lb

## 2020-10-03 DIAGNOSIS — D500 Iron deficiency anemia secondary to blood loss (chronic): Secondary | ICD-10-CM | POA: Diagnosis present

## 2020-10-03 DIAGNOSIS — K317 Polyp of stomach and duodenum: Secondary | ICD-10-CM

## 2020-10-03 DIAGNOSIS — K449 Diaphragmatic hernia without obstruction or gangrene: Secondary | ICD-10-CM

## 2020-10-03 MED ORDER — SODIUM CHLORIDE 0.9 % IV SOLN: 500.0000 mL | Freq: Once | INTRAVENOUS | Status: DC

## 2020-10-03 MED ORDER — FERROUS SULFATE 325 (65 FE) MG PO TBEC: 325.0000 mg | DELAYED_RELEASE_TABLET | Freq: Every day | ORAL | 3 refills | Status: AC

## 2020-10-03 NOTE — Progress Notes (Signed)
D.T. vital signs. °

## 2020-10-03 NOTE — Patient Instructions (Signed)
There was a small hiatal hernia and a small stomach polyp which I biopsied. It looks benign and innocent.  You have low iron and anemia from regular blood donation, I think. Do not donate blood again - you have done your part.  Please take your iron supplement and follow-up with primary care, Dr. Melford Aase.  I appreciate the opportunity to care for you. Gatha Mayer, MD, [facility]

## 2020-10-03 NOTE — Progress Notes (Signed)
Pt's states no medical or surgical changes since previsit or office visit. 

## 2020-10-03 NOTE — Progress Notes (Signed)
A/ox3, pleased with MAC, report to RN 

## 2020-10-03 NOTE — Progress Notes (Signed)
History and Physical Interval Note:  10/03/2020 11:21 AM  Paul Hicks. [place]  has presented today for endoscopic procedure(s), with the diagnosis of  Encounter Diagnosis  Name Primary?   Iron deficiency anemia due to chronic blood loss Yes  .  The various methods of evaluation and treatment have been discussed with the patient and/or family. After consideration of risks, benefits and other options for treatment, the patient has consented to  the endoscopic procedure(s).   The patient's history has been reviewed, patient examined, no change in status, stable for surgery.  I have reviewed the patient's chart and labs.  Questions were answered to the patient's satisfaction.     Gatha Mayer, MD, [facility]

## 2020-10-03 NOTE — Progress Notes (Signed)
Called to room to assist during endoscopic procedure.  Patient ID and intended procedure confirmed with present staff. Received instructions for my participation in the procedure from the performing physician.  

## 2020-10-03 NOTE — Op Note (Signed)
Paul Hicks Patient Name: Paul Hicks Procedure Date: 10/03/2020 11:08 AM MRN: [id number] Endoscopist: Gatha Mayer , MD Age: 70 Referring MD:  Date of Birth: 03-17-1950 Gender: Male Account #: [id number] Procedure:                Upper GI endoscopy Indications:              Iron deficiency anemia secondary to chronic blood                            loss Medicines:                Propofol per Anesthesia, Monitored Anesthesia Care Procedure:                Pre-Anesthesia Assessment:                           - Prior to the procedure, a History and Physical                            was performed, and patient medications and                            allergies were reviewed. The patient's tolerance of                            previous anesthesia was also reviewed. The risks                            and benefits of the procedure and the sedation                            options and risks were discussed with the patient.                            All questions were answered, and informed consent                            was obtained. Prior Anticoagulants: The patient has                            taken no previous anticoagulant or antiplatelet                            agents. ASA Grade Assessment: II - A patient with                            mild systemic disease. After reviewing the risks                            and benefits, the patient was deemed in                            satisfactory condition to undergo the procedure.  After obtaining informed consent, the endoscope was                            passed under direct vision. Throughout the                            procedure, the patient's blood pressure, pulse, and                            oxygen saturations were monitored continuously. The                            GIF [id number] was introduced through the                            mouth, and advanced to the  second part of duodenum.                            The upper GI endoscopy was accomplished without                            difficulty. The patient tolerated the procedure                            well. Scope In: Scope Out: Findings:                 The Z-line was irregular.                           A 2 cm hiatal hernia was present.                           A single diminutive sessile polyp was found in the                            cardia. Biopsies were taken with a cold forceps for                            histology. Verification of patient identification                            for the specimen was done. Estimated blood loss was                            minimal.                           The cardia and gastric fundus were otherwise normal                            on retroflexion.                           A medium diverticulum was found in the second  portion of the duodenum. Complications:            No immediate complications. Estimated Blood Loss:     Estimated blood loss was minimal. Estimated blood                            loss was minimal. Impression:               - Z-line irregular.                           - 2 cm hiatal hernia.                           - A single gastric polyp. Biopsied.                           - Normal examined duodenum. Recommendation:           - Patient has a contact number available for                            emergencies. The signs and symptoms of potential                            delayed complications were discussed with the                            patient. Return to normal activities tomorrow.                            Written discharge instructions were provided to the                            patient.                           - Resume previous diet.                           - Continue present medications.                           - Await pathology results.                            - STOP BLOOD DONATION - THINK THIS WAS CAUSE OF                            ANEMIA                           START FERROUS SULFATE QD AND FOLLOW-UP PCP                           NO FURTHER GI EVAL NEEDED - HAS HAD COLONOSCOPY IN  2020 Gatha Mayer, MD 10/03/2020 12:10:58 PM This report has been signed electronically.

## 2020-10-04 ENCOUNTER — Ambulatory Visit
Admission: RE | Admit: 2020-10-04 | Discharge: 2020-10-04 | Disposition: A | Discharge status: Home / Self Care | Payer: Medicare HMO | Source: Ambulatory Visit | Attending: Orthopedic Surgery | Admitting: Orthopedic Surgery

## 2020-10-04 DIAGNOSIS — M75112 Incomplete rotator cuff tear or rupture of left shoulder, not specified as traumatic: Secondary | ICD-10-CM

## 2020-10-05 ENCOUNTER — Other Ambulatory Visit: Payer: Medicare HMO

## 2020-10-07 ENCOUNTER — Telehealth: Payer: Self-pay | Admitting: *Deleted

## 2020-10-07 NOTE — Telephone Encounter (Signed)
  Follow up Call-  Call back number 10/03/2020 10/24/2018  Post procedure Call Back phone  # [phone]  Permission to leave phone message Yes Yes  Some recent data might be hidden     Patient questions:  Do you have a fever, pain , or abdominal swelling? No. Pain Score  0 *  Have you tolerated food without any problems? Yes.    Have you been able to return to your normal activities? Yes.    Do you have any questions about your discharge instructions: Diet   No. Medications  No. Follow up visit  No.  Do you have questions or concerns about your Care? No.  Actions: * If pain score is 4 or above: No action needed, pain <4.

## 2020-10-09 ENCOUNTER — Ambulatory Visit (INDEPENDENT_AMBULATORY_CARE_PROVIDER_SITE_OTHER): Payer: Medicare HMO | Admitting: Orthopedic Surgery

## 2020-10-09 ENCOUNTER — Other Ambulatory Visit: Payer: Self-pay

## 2020-10-09 DIAGNOSIS — M25512 Pain in left shoulder: Secondary | ICD-10-CM | POA: Diagnosis not present

## 2020-10-09 DIAGNOSIS — G8929 Other chronic pain: Secondary | ICD-10-CM

## 2020-10-09 DIAGNOSIS — M75112 Incomplete rotator cuff tear or rupture of left shoulder, not specified as traumatic: Secondary | ICD-10-CM | POA: Diagnosis not present

## 2020-10-20 ENCOUNTER — Encounter: Payer: Self-pay | Admitting: Internal Medicine

## 2020-10-22 ENCOUNTER — Encounter: Payer: Self-pay | Admitting: Orthopedic Surgery

## 2020-10-22 NOTE — Progress Notes (Signed)
Office Visit Note   Patient: Paul Hicks           Date of Birth: 12-15-1950           MRN: [id number] Visit Date: 10/09/2020              Requested by: Chesley Noon, MD [address] [place],  [place] [address] PCP: Chesley Noon, MD  Chief Complaint  Patient presents with   Left Shoulder - Follow-up    MRI L shoulder       HPI: Patient is a 70 year old gentleman who presents in follow-up for his left shoulder status post MRI scan.  He has pain with activities of daily living using the shoulder.  Assessment & Plan: Visit Diagnoses:  1. Chronic left shoulder pain   2. Nontraumatic incomplete tear of left rotator cuff     Plan: We will set patient up with physical therapy for 4 weeks follow-up at that time discussed that if he is not showing improvement we could consider arthroscopic debridement.  Follow-Up Instructions: Return in about 4 weeks (around 11/06/2020).   Ortho Exam  Patient is alert, oriented, no adenopathy, well-dressed, normal affect, normal respiratory effort. Examination patient has abduction and flexion to 90 degrees he has pain with Neer and Hawkins impingement test the biceps tendon is minimally tender to palpation.  Review of the MRI scan shows tendinosis of the supraspinatus tendon and infraspinatus tendon with partial thickness tearing.  There is also mild subacromial and subdeltoid bursitis.  Imaging: No results found. No images are attached to the encounter.  Labs: No results found for: HGBA1C, ESRSEDRATE, CRP, LABURIC, REPTSTATUS, GRAMSTAIN, CULT, LABORGA   Lab Results  Component Value Date   ALBUMIN 4.5 02/04/2020   ALBUMIN 4.5 11/08/2019   ALBUMIN 4.0 01/19/2010    No results found for: MG No results found for: VD25OH  No results found for: PREALBUMIN CBC EXTENDED Latest Ref Rng & Units 01/19/2010  WBC 4.0 - 10.5 K/uL 9.9  RBC 4.22 - 5.81 MIL/uL 6.12(H)  HGB 13.0 - 17.0 g/dL 17.5(H)  HCT 39.0 - 52.0 % 51.6  PLT 150 -  400 K/uL 216  NEUTROABS 1.7 - 7.7 K/uL 8.4(H)  LYMPHSABS 0.7 - 4.0 K/uL 0.8     There is no height or weight on file to calculate BMI.  Orders:  No orders of the defined types were placed in this encounter.  No orders of the defined types were placed in this encounter.    Procedures: No procedures performed  Clinical Data: No additional findings.  ROS:  All other systems negative, except as noted in the HPI. Review of Systems  Objective: Vital Signs: There were no vitals taken for this visit.  Specialty Comments:  No specialty comments available.  PMFS History: Patient Active Problem List   Diagnosis Date Noted   Diverticulosis of colon without hemorrhage    Family history of colon cancer - brother 02/11/2015   History of adenomatous polyp of colon 07/30/2014   Past Medical History:  Diagnosis Date   Allergy    Pollen - no meds    Anxiety    Arthritis    left shoulder, neck, back    Cataract    removed both eyes    Diabetes mellitus without complication ([place])    On Metformin    GERD (gastroesophageal reflux disease)    History of adenomatous polyp of colon 07/30/2014   Hypertension    Polyp of colon  Family History  Problem Relation Age of Onset   Colon cancer Brother    Colon polyps Brother    Esophageal cancer Neg Hx    Rectal cancer Neg Hx    Stomach cancer Neg Hx     Past Surgical History:  Procedure Laterality Date   CATARACT EXTRACTION Bilateral    COLONOSCOPY     COLONOSCOPY N/A 02/11/2015   Procedure: COLONOSCOPY;  Surgeon: Gatha Mayer, MD;  Location: [facility];  Service: Endoscopy;  Laterality: N/A;   COLONOSCOPY WITH PROPOFOL N/A 10/07/2014   Procedure: COLONOSCOPY WITH PROPOFOL;  Surgeon: Inda Castle, MD;  Location: [facility];  Service: Endoscopy;  Laterality: N/A;   COLONOSCOPY WITH PROPOFOL N/A 11/04/2015   Procedure: COLONOSCOPY WITH PROPOFOL;  Surgeon: Gatha Mayer, MD;  Location: [facility];  Service: Endoscopy;   Laterality: N/A;   ESOPHAGOGASTRODUODENOSCOPY  10/02/2020   hyperplastic gastric polyp   HOT HEMOSTASIS N/A 10/07/2014   Procedure: HOT HEMOSTASIS (ARGON PLASMA COAGULATION/BICAP);  Surgeon: Inda Castle, MD;  Location: [facility];  Service: Endoscopy;  Laterality: N/A;   HOT HEMOSTASIS N/A 02/11/2015   Procedure: HOT HEMOSTASIS (ARGON PLASMA COAGULATION/BICAP);  Surgeon: Gatha Mayer, MD;  Location: [facility];  Service: Endoscopy;  Laterality: N/A;   right foot     SHOULDER ARTHROSCOPY W/ ROTATOR CUFF REPAIR     both shoulders   Social History   Occupational History   Not on file  Tobacco Use   Smoking status: Former    Types: Cigarettes    Quit date: 10/19/1980    Years since quitting: 40.0   Smokeless tobacco: Never  Vaping Use   Vaping Use: Never used  Substance and Sexual Activity   Alcohol use: Yes    Alcohol/week: 1.0 standard drink    Types: 1 Cans of beer per week    Comment: occasionally   Drug use: No   Sexual activity: Not on file

## 2020-11-04 ENCOUNTER — Ambulatory Visit: Payer: Medicare HMO | Admitting: Cardiology

## 2020-11-06 ENCOUNTER — Encounter: Payer: Self-pay | Admitting: Orthopedic Surgery

## 2020-11-06 ENCOUNTER — Other Ambulatory Visit: Payer: Self-pay

## 2020-11-06 ENCOUNTER — Ambulatory Visit (INDEPENDENT_AMBULATORY_CARE_PROVIDER_SITE_OTHER): Payer: Medicare HMO | Admitting: Physician Assistant

## 2020-11-06 DIAGNOSIS — M25512 Pain in left shoulder: Secondary | ICD-10-CM

## 2020-11-06 DIAGNOSIS — G8929 Other chronic pain: Secondary | ICD-10-CM

## 2020-11-06 NOTE — Progress Notes (Signed)
Office Visit Note   Patient: Paul Hicks           Date of Birth: 05-15-1950           MRN: [id number] Visit Date: 11/06/2020              Requested by: Chesley Noon, MD [address] [place],  [place] [address] PCP: Chesley Noon, MD  Chief Complaint  Patient presents with   Left Shoulder - Follow-up      HPI: Patient presents in follow-up today for his left shoulder.  He is left-handed and he has been having increasing pain in his shoulder.  His affect his daily activities such as driving and walking.  He states when he takes his trash can to the curb and comes back his shoulder is quite sore.  He has failed conservative treatment including medication, injections, and physical therapy.  He has been doing a home exercise program that was taught to him by therapy.  He has not seen any change in his symptoms.  He points to the systems is going down the back of his arm and from his shoulder into his chest.  He did see his primary care provider who did not find any kind of cardiac reasoning for this pain.  He has a very remote history of a shoulder arthroscopy in the past and he did very well for a long time  Assessment & Plan: Visit Diagnoses: No diagnosis found.  Plan: Patient would like to go forward with left shoulder arthroscopy debridement.  Reviewed the risks outcomes and recovery from the procedure.  We will contact him to schedule a time.  Follow-Up Instructions: No follow-ups on file.   Ortho Exam  Patient is alert, oriented, no adenopathy, well-dressed, normal affect, normal respiratory effort. Patient has forward elevation to 160 he can internally rotate to his mid back but is quite painful.  He has some weakness with resisted abduction.  Positive impingement findings.  Heart regular rate and rhythm lungs clear  Imaging: No results found. No images are attached to the encounter.  Labs: No results found for: HGBA1C, ESRSEDRATE, CRP, LABURIC, REPTSTATUS,  GRAMSTAIN, CULT, LABORGA   Lab Results  Component Value Date   ALBUMIN 4.5 02/04/2020   ALBUMIN 4.5 11/08/2019   ALBUMIN 4.0 01/19/2010    No results found for: MG No results found for: VD25OH  No results found for: PREALBUMIN CBC EXTENDED Latest Ref Rng & Units 01/19/2010  WBC 4.0 - 10.5 K/uL 9.9  RBC 4.22 - 5.81 MIL/uL 6.12(H)  HGB 13.0 - 17.0 g/dL 17.5(H)  HCT 39.0 - 52.0 % 51.6  PLT 150 - 400 K/uL 216  NEUTROABS 1.7 - 7.7 K/uL 8.4(H)  LYMPHSABS 0.7 - 4.0 K/uL 0.8     There is no height or weight on file to calculate BMI.  Orders:  No orders of the defined types were placed in this encounter.  No orders of the defined types were placed in this encounter.    Procedures: No procedures performed  Clinical Data: No additional findings.  ROS:  All other systems negative, except as noted in the HPI. Review of Systems  Objective: Vital Signs: There were no vitals taken for this visit.  Specialty Comments:  No specialty comments available.  PMFS History: Patient Active Problem List   Diagnosis Date Noted   Diverticulosis of colon without hemorrhage    Family history of colon cancer - brother 02/11/2015   History of adenomatous  polyp of colon 07/30/2014   Past Medical History:  Diagnosis Date   Allergy    Pollen - no meds    Anxiety    Arthritis    left shoulder, neck, back    Cataract    removed both eyes    Diabetes mellitus without complication ([facility])    On Metformin    GERD (gastroesophageal reflux disease)    History of adenomatous polyp of colon 07/30/2014   Hypertension    Polyp of colon     Family History  Problem Relation Age of Onset   Colon cancer Brother    Colon polyps Brother    Esophageal cancer Neg Hx    Rectal cancer Neg Hx    Stomach cancer Neg Hx     Past Surgical History:  Procedure Laterality Date   CATARACT EXTRACTION Bilateral    COLONOSCOPY     COLONOSCOPY N/A 02/11/2015   Procedure: COLONOSCOPY;  Surgeon: Gatha Mayer, MD;  Location: [facility];  Service: Endoscopy;  Laterality: N/A;   COLONOSCOPY WITH PROPOFOL N/A 10/07/2014   Procedure: COLONOSCOPY WITH PROPOFOL;  Surgeon: Inda Castle, MD;  Location: [facility];  Service: Endoscopy;  Laterality: N/A;   COLONOSCOPY WITH PROPOFOL N/A 11/04/2015   Procedure: COLONOSCOPY WITH PROPOFOL;  Surgeon: Gatha Mayer, MD;  Location: [facility];  Service: Endoscopy;  Laterality: N/A;   ESOPHAGOGASTRODUODENOSCOPY  10/02/2020   hyperplastic gastric polyp   HOT HEMOSTASIS N/A 10/07/2014   Procedure: HOT HEMOSTASIS (ARGON PLASMA COAGULATION/BICAP);  Surgeon: Inda Castle, MD;  Location: [facility];  Service: Endoscopy;  Laterality: N/A;   HOT HEMOSTASIS N/A 02/11/2015   Procedure: HOT HEMOSTASIS (ARGON PLASMA COAGULATION/BICAP);  Surgeon: Gatha Mayer, MD;  Location: [facility];  Service: Endoscopy;  Laterality: N/A;   right foot     SHOULDER ARTHROSCOPY W/ ROTATOR CUFF REPAIR     both shoulders   Social History   Occupational History   Not on file  Tobacco Use   Smoking status: Former    Types: Cigarettes    Quit date: 10/19/1980    Years since quitting: 40.0   Smokeless tobacco: Never  Vaping Use   Vaping Use: Never used  Substance and Sexual Activity   Alcohol use: Yes    Alcohol/week: 1.0 standard drink    Types: 1 Cans of beer per week    Comment: occasionally   Drug use: No   Sexual activity: Not on file

## 2020-12-26 ENCOUNTER — Telehealth: Payer: Self-pay | Admitting: Orthopedic Surgery

## 2020-12-26 NOTE — Telephone Encounter (Signed)
Do you have a blue sheet on this pt?

## 2020-12-26 NOTE — Telephone Encounter (Signed)
Patient called. He would like to know if he will be having surgery on his shoulder. His call back number is [phone]

## 2020-12-29 ENCOUNTER — Encounter: Payer: Self-pay | Admitting: Orthopedic Surgery

## 2021-01-20 ENCOUNTER — Other Ambulatory Visit: Payer: Self-pay

## 2021-01-20 ENCOUNTER — Encounter (HOSPITAL_COMMUNITY): Payer: Self-pay | Admitting: Orthopedic Surgery

## 2021-01-20 NOTE — Progress Notes (Addendum)
PCP - Dr. Melford Aase Cardiologist - Dr. Stanford Breed EKG - 08/01/20 Chest x-ray -  ECHO -  Cardiac Cath -  CPAP -   Fasting Blood Sugar:  14-150 Checks Blood Sugar:  1x/day  Blood Thinner Instructions:  Aspirin Instructions: Follow your surgeon's instructions on when to stop Aspirin.  If no instructions were given by your surgeon then you will need to call the office to get those instructions.    ERAS Protcol -   COVID TEST- n/a  Anesthesia review: n/a  -------------  SDW INSTRUCTIONS:  Your procedure is scheduled on Wednesday 11/30 . Please report to [facility] Main Entrance "A" at [place].M., and check in at the Admitting office. Call this number if you have problems the morning of surgery: [phone]   Remember: Do not eat or drink after midnight the night before your surgery   Medications to take morning of surgery with a sip of water include: cloNIDine (CATAPRES)  diltiazem (CARDIZEM CD)  FLUoxetine (PROZAC)  omeprazole (PRILOSEC) rosuvastatin (CRESTOR)    As of today, STOP taking any Aspirin (unless otherwise instructed by your surgeon), Aleve, Naproxen, Ibuprofen, Motrin, Advil, Goody's, BC's, all herbal medications, fish oil, and all vitamins.  ** PLEASE check your blood sugar the morning of your surgery when you wake up and every 2 hours until you get to the Short Stay unit.  If your blood sugar is less than 70 mg/dL, you will need to treat for low blood sugar: Do not take insulin. Treat a low blood sugar (less than 70 mg/dL) with  cup of clear juice (cranberry or apple), 4 glucose tablets, OR glucose gel. Recheck blood sugar in 15 minutes after treatment (to make sure it is greater than 70 mg/dL). If your blood sugar is not greater than 70 mg/dL on recheck, call [phone] for further instructions.   The Morning of Surgery Do not wear jewelry Do not wear lotions, powders, colognes, or deodorant  Men may shave face and neck. Do not bring valuables to the  hospital. [facility] is not responsible for any belongings or valuables.  If you are a smoker, DO NOT Smoke 24 hours prior to surgery  If you wear a CPAP at night please bring your mask the morning of surgery   Remember that you must have someone to transport you home after your surgery, and remain with you for 24 hours if you are discharged the same day.  Please bring cases for contacts, glasses, hearing aids, dentures or bridgework because it cannot be worn into surgery.   Patients discharged the day of surgery will not be allowed to drive home.   Please shower the NIGHT BEFORE/MORNING OF SURGERY (use antibacterial soap like DIAL soap if possible). Wear comfortable clothes the morning of surgery. Oral Hygiene is also important to reduce your risk of infection.  Remember - BRUSH YOUR TEETH THE MORNING OF SURGERY WITH YOUR REGULAR TOOTHPASTE  Patient denies shortness of breath, fever, cough and chest pain.

## 2021-01-20 NOTE — Anesthesia Preprocedure Evaluation (Addendum)
Anesthesia Evaluation  Patient identified by MRN, date of birth, ID band Patient awake    Reviewed: Allergy & Precautions, NPO status , Patient's Chart, lab work & pertinent test results  Airway Mallampati: III  TM Distance: >3 FB Neck ROM: Full    Dental  (+) Teeth Intact, Dental Advisory Given   Pulmonary former smoker,  Quit smoking 1982- 20 pack year history    Pulmonary exam normal breath sounds clear to auscultation       Cardiovascular hypertension, Pt. on medications Normal cardiovascular exam Rhythm:Regular Rate:Normal     Neuro/Psych PSYCHIATRIC DISORDERS Anxiety negative neurological ROS     GI/Hepatic Neg liver ROS, GERD  Medicated and Controlled,  Endo/Other  diabetes, Well Controlled, Type 2, Oral Hypoglycemic AgentsObesity BMI 36  Renal/GU negative Renal ROS  negative genitourinary   Musculoskeletal  (+) Arthritis , Osteoarthritis,    Abdominal (+) + obese,   Peds  Hematology negative hematology ROS (+)   Anesthesia Other Findings   Reproductive/Obstetrics negative OB ROS                            Anesthesia Physical Anesthesia Plan  ASA: 2  Anesthesia Plan: General and Regional   Post-op Pain Management: Tylenol PO (pre-op) and Regional block   Induction: Intravenous  PONV Risk Score and Plan: 2 and Ondansetron, Dexamethasone and Treatment may vary due to age or medical condition  Airway Management Planned: Oral ETT and Video Laryngoscope Planned  Additional Equipment: None  Intra-op Plan:   Post-operative Plan: Extubation in OR  Informed Consent: I have reviewed the patients History and Physical, chart, labs and discussed the procedure including the risks, benefits and alternatives for the proposed anesthesia with the patient or authorized representative who has indicated his/her understanding and acceptance.     Dental advisory given  Plan Discussed  with: CRNA  Anesthesia Plan Comments:        Anesthesia Quick Evaluation

## 2021-01-21 ENCOUNTER — Encounter (HOSPITAL_COMMUNITY): Payer: Self-pay | Admitting: Orthopedic Surgery

## 2021-01-21 ENCOUNTER — Ambulatory Visit (HOSPITAL_COMMUNITY)
Admission: RE | Admit: 2021-01-21 | Discharge: 2021-01-21 | Disposition: A | Discharge status: Home / Self Care | Payer: Medicare HMO | Attending: Orthopedic Surgery | Admitting: Orthopedic Surgery

## 2021-01-21 ENCOUNTER — Ambulatory Visit (HOSPITAL_COMMUNITY): Payer: Medicare HMO | Admitting: Anesthesiology

## 2021-01-21 ENCOUNTER — Encounter (HOSPITAL_COMMUNITY)
Admission: RE | Disposition: A | Discharge status: Home / Self Care | Payer: Self-pay | Source: Home / Self Care | Attending: Orthopedic Surgery

## 2021-01-21 ENCOUNTER — Other Ambulatory Visit: Payer: Self-pay

## 2021-01-21 DIAGNOSIS — S46112A Strain of muscle, fascia and tendon of long head of biceps, left arm, initial encounter: Secondary | ICD-10-CM | POA: Diagnosis not present

## 2021-01-21 DIAGNOSIS — M7542 Impingement syndrome of left shoulder: Secondary | ICD-10-CM | POA: Insufficient documentation

## 2021-01-21 DIAGNOSIS — Y939 Activity, unspecified: Secondary | ICD-10-CM | POA: Insufficient documentation

## 2021-01-21 DIAGNOSIS — E119 Type 2 diabetes mellitus without complications: Secondary | ICD-10-CM | POA: Insufficient documentation

## 2021-01-21 DIAGNOSIS — M65812 Other synovitis and tenosynovitis, left shoulder: Secondary | ICD-10-CM | POA: Diagnosis not present

## 2021-01-21 DIAGNOSIS — X58XXXA Exposure to other specified factors, initial encounter: Secondary | ICD-10-CM | POA: Diagnosis not present

## 2021-01-21 DIAGNOSIS — M75112 Incomplete rotator cuff tear or rupture of left shoulder, not specified as traumatic: Secondary | ICD-10-CM | POA: Diagnosis not present

## 2021-01-21 DIAGNOSIS — M75111 Incomplete rotator cuff tear or rupture of right shoulder, not specified as traumatic: Secondary | ICD-10-CM

## 2021-01-21 HISTORY — PX: SHOULDER ARTHROSCOPY: SHX128

## 2021-01-21 LAB — CBC
HCT: 45.7 % (ref 39.0–52.0)
Hemoglobin: 14.5 g/dL (ref 13.0–17.0)
MCH: 26 pg (ref 26.0–34.0)
MCHC: 31.7 g/dL (ref 30.0–36.0)
MCV: 81.9 fL (ref 80.0–100.0)
Platelets: 228 10*3/uL (ref 150–400)
RBC: 5.58 MIL/uL (ref 4.22–5.81)
RDW: 15.7 % — ABNORMAL HIGH (ref 11.5–15.5)
WBC: 8.5 10*3/uL (ref 4.0–10.5)
nRBC: 0 % (ref 0.0–0.2)

## 2021-01-21 LAB — GLUCOSE, CAPILLARY
Glucose-Capillary: 167 mg/dL — ABNORMAL HIGH (ref 70–99)
Glucose-Capillary: 152 mg/dL — ABNORMAL HIGH (ref 70–99)

## 2021-01-21 LAB — BASIC METABOLIC PANEL
Anion gap: 10 (ref 5–15)
BUN: 15 mg/dL (ref 8–23)
CO2: 27 mmol/L (ref 22–32)
Calcium: 8.8 mg/dL — ABNORMAL LOW (ref 8.9–10.3)
Chloride: 99 mmol/L (ref 98–111)
Creatinine, Ser: 0.77 mg/dL (ref 0.61–1.24)
GFR, Estimated: >60 mL/min (ref >60)
Glucose, Bld: 161 mg/dL — ABNORMAL HIGH (ref 70–99)
Potassium: 3.5 mmol/L (ref 3.5–5.1)
Sodium: 136 mmol/L (ref 135–145)

## 2021-01-21 SURGERY — ARTHROSCOPY, SHOULDER
Anesthesia: Regional | Site: Shoulder | Laterality: Left

## 2021-01-21 MED ORDER — OXYCODONE HCL 5 MG/5ML PO SOLN: 5.0000 mg | Freq: Once | ORAL | Status: DC | PRN

## 2021-01-21 MED ORDER — PHENYLEPHRINE 40 MCG/ML (10ML) SYRINGE FOR IV PUSH (FOR BLOOD PRESSURE SUPPORT)
PREFILLED_SYRINGE | INTRAVENOUS | Status: AC
  Filled 2021-01-21: qty 10

## 2021-01-21 MED ORDER — HYDROCODONE-ACETAMINOPHEN 5-325 MG PO TABS: 1.0000 | ORAL_TABLET | ORAL | 0 refills | Status: DC | PRN

## 2021-01-21 MED ORDER — BUPIVACAINE LIPOSOME 1.3 % IJ SUSP
INTRAMUSCULAR | Status: DC | PRN
  Administered 2021-01-21: 10 mL via PERINEURAL

## 2021-01-21 MED ORDER — OXYCODONE HCL 5 MG PO TABS: 5.0000 mg | ORAL_TABLET | Freq: Once | ORAL | Status: DC | PRN

## 2021-01-21 MED ORDER — PROPOFOL 10 MG/ML IV BOLUS
INTRAVENOUS | Status: DC | PRN
  Administered 2021-01-21: 130 mg via INTRAVENOUS

## 2021-01-21 MED ORDER — CHLORHEXIDINE GLUCONATE 0.12 % MT SOLN
OROMUCOSAL | Status: AC
  Administered 2021-01-21: 15 mL via OROMUCOSAL
  Filled 2021-01-21: qty 15

## 2021-01-21 MED ORDER — DEXAMETHASONE SODIUM PHOSPHATE 10 MG/ML IJ SOLN
INTRAMUSCULAR | Status: DC | PRN
  Administered 2021-01-21: 10 mg via INTRAVENOUS

## 2021-01-21 MED ORDER — ORAL CARE MOUTH RINSE: 15.0000 mL | Freq: Once | OROMUCOSAL | Status: AC

## 2021-01-21 MED ORDER — MIDAZOLAM HCL 2 MG/2ML IJ SOLN
INTRAMUSCULAR | Status: AC
  Filled 2021-01-21: qty 2

## 2021-01-21 MED ORDER — CEFAZOLIN IN SODIUM CHLORIDE 3-0.9 GM/100ML-% IV SOLN
3.0000 g | INTRAVENOUS | Status: AC
  Administered 2021-01-21: 3 g via INTRAVENOUS

## 2021-01-21 MED ORDER — MIDAZOLAM HCL 5 MG/5ML IJ SOLN
INTRAMUSCULAR | Status: DC | PRN
  Administered 2021-01-21: 1 mg via INTRAVENOUS

## 2021-01-21 MED ORDER — LIDOCAINE 2% (20 MG/ML) 5 ML SYRINGE
INTRAMUSCULAR | Status: AC
  Filled 2021-01-21: qty 5

## 2021-01-21 MED ORDER — HYDROMORPHONE HCL 1 MG/ML IJ SOLN: 0.2500 mg | INTRAMUSCULAR | Status: DC | PRN

## 2021-01-21 MED ORDER — ONDANSETRON HCL 4 MG/2ML IJ SOLN
INTRAMUSCULAR | Status: DC | PRN
  Administered 2021-01-21: 4 mg via INTRAVENOUS

## 2021-01-21 MED ORDER — ROCURONIUM BROMIDE 10 MG/ML (PF) SYRINGE
PREFILLED_SYRINGE | INTRAVENOUS | Status: AC
  Filled 2021-01-21: qty 10

## 2021-01-21 MED ORDER — CHLORHEXIDINE GLUCONATE 0.12 % MT SOLN: 15.0000 mL | Freq: Once | OROMUCOSAL | Status: AC

## 2021-01-21 MED ORDER — LACTATED RINGERS IV SOLN: INTRAVENOUS | Status: DC

## 2021-01-21 MED ORDER — ONDANSETRON HCL 4 MG/2ML IJ SOLN
INTRAMUSCULAR | Status: AC
  Filled 2021-01-21: qty 2

## 2021-01-21 MED ORDER — SODIUM CHLORIDE 0.9 % IR SOLN
Status: DC | PRN
  Administered 2021-01-21 (×2): 3000 mL

## 2021-01-21 MED ORDER — ROCURONIUM BROMIDE 10 MG/ML (PF) SYRINGE
PREFILLED_SYRINGE | INTRAVENOUS | Status: DC | PRN
  Administered 2021-01-21: 100 mg via INTRAVENOUS

## 2021-01-21 MED ORDER — BUPIVACAINE-EPINEPHRINE (PF) 0.5% -1:200000 IJ SOLN
INTRAMUSCULAR | Status: DC | PRN
  Administered 2021-01-21: 15 mL via PERINEURAL

## 2021-01-21 MED ORDER — CEFAZOLIN IN SODIUM CHLORIDE 3-0.9 GM/100ML-% IV SOLN
INTRAVENOUS | Status: AC
  Filled 2021-01-21: qty 100

## 2021-01-21 MED ORDER — ACETAMINOPHEN 500 MG PO TABS
1000.0000 mg | ORAL_TABLET | Freq: Once | ORAL | Status: AC
  Administered 2021-01-21: 1000 mg via ORAL
  Filled 2021-01-21: qty 2

## 2021-01-21 MED ORDER — FENTANYL CITRATE (PF) 250 MCG/5ML IJ SOLN
INTRAMUSCULAR | Status: DC | PRN
  Administered 2021-01-21 (×2): 50 ug via INTRAVENOUS

## 2021-01-21 MED ORDER — SUGAMMADEX SODIUM 200 MG/2ML IV SOLN
INTRAVENOUS | Status: DC | PRN
  Administered 2021-01-21: 250 mg via INTRAVENOUS

## 2021-01-21 MED ORDER — 0.9 % SODIUM CHLORIDE (POUR BTL) OPTIME
TOPICAL | Status: DC | PRN
  Administered 2021-01-21: 1000 mL

## 2021-01-21 MED ORDER — PHENYLEPHRINE HCL-NACL 20-0.9 MG/250ML-% IV SOLN
INTRAVENOUS | Status: DC | PRN
Start: 2021-01-21 — End: 2021-01-21
  Administered 2021-01-21: 20 ug/min via INTRAVENOUS

## 2021-01-21 MED ORDER — BUPIVACAINE LIPOSOME 1.3 % IJ SUSP
INTRAMUSCULAR | Status: DC | PRN
  Administered 2021-01-21: 15 mL via PERINEURAL

## 2021-01-21 MED ORDER — FENTANYL CITRATE (PF) 250 MCG/5ML IJ SOLN
INTRAMUSCULAR | Status: AC
  Filled 2021-01-21: qty 5

## 2021-01-21 MED ORDER — LIDOCAINE 2% (20 MG/ML) 5 ML SYRINGE
INTRAMUSCULAR | Status: DC | PRN
  Administered 2021-01-21: 60 mg via INTRAVENOUS

## 2021-01-21 MED ORDER — PROPOFOL 10 MG/ML IV BOLUS
INTRAVENOUS | Status: AC
  Filled 2021-01-21: qty 20

## 2021-01-21 MED ORDER — PHENYLEPHRINE 40 MCG/ML (10ML) SYRINGE FOR IV PUSH (FOR BLOOD PRESSURE SUPPORT)
PREFILLED_SYRINGE | INTRAVENOUS | Status: DC | PRN
  Administered 2021-01-21: 80 ug via INTRAVENOUS

## 2021-01-21 MED ORDER — DEXAMETHASONE SODIUM PHOSPHATE 10 MG/ML IJ SOLN
INTRAMUSCULAR | Status: AC
  Filled 2021-01-21: qty 1

## 2021-01-21 MED ORDER — ONDANSETRON HCL 4 MG/2ML IJ SOLN: 4.0000 mg | Freq: Once | INTRAMUSCULAR | Status: DC | PRN

## 2021-01-21 SURGICAL SUPPLY — 33 items
BAG COUNTER SPONGE SURGICOUNT (BAG) ×2 IMPLANT
BAG SPNG CNTER NS LX DISP (BAG) ×1
BLADE EXCALIBUR 4.0X13 (MISCELLANEOUS) ×2 IMPLANT
BUR OVAL 6.0 (BURR) ×2 IMPLANT
CANNULA SHOULDER 7CM (CANNULA) ×2 IMPLANT
COVER SURGICAL LIGHT HANDLE (MISCELLANEOUS) ×4 IMPLANT
DRAPE STERI 35X30 U-POUCH (DRAPES) ×2 IMPLANT
DRAPE U-SHAPE 47X51 STRL (DRAPES) ×2 IMPLANT
DRSG ADAPTIC 3X8 NADH LF (GAUZE/BANDAGES/DRESSINGS) ×1 IMPLANT
DRSG EMULSION OIL 3X3 NADH (GAUZE/BANDAGES/DRESSINGS) ×2 IMPLANT
DRSG PAD ABDOMINAL 8X10 ST (GAUZE/BANDAGES/DRESSINGS) ×4 IMPLANT
DURAPREP 26ML APPLICATOR (WOUND CARE) ×2 IMPLANT
GAUZE SPONGE 4X4 12PLY STRL (GAUZE/BANDAGES/DRESSINGS) ×2 IMPLANT
GLOVE SURG ORTHO LTX SZ9 (GLOVE) ×2 IMPLANT
GLOVE SURG UNDER POLY LF SZ9 (GLOVE) ×2 IMPLANT
GOWN STRL REUS W/ TWL XL LVL3 (GOWN DISPOSABLE) ×2 IMPLANT
GOWN STRL REUS W/TWL XL LVL3 (GOWN DISPOSABLE) ×4
KIT BASIN OR (CUSTOM PROCEDURE TRAY) ×2 IMPLANT
KIT TURNOVER KIT B (KITS) ×2 IMPLANT
MANIFOLD NEPTUNE II (INSTRUMENTS) ×2 IMPLANT
NEEDLE SPNL 18GX3.5 QUINCKE PK (NEEDLE) ×2 IMPLANT
NS IRRIG 1000ML POUR BTL (IV SOLUTION) ×2 IMPLANT
PACK SHOULDER (CUSTOM PROCEDURE TRAY) ×2 IMPLANT
PAD ABD 8X10 STRL (GAUZE/BANDAGES/DRESSINGS) ×1 IMPLANT
PAD ARMBOARD 7.5X6 YLW CONV (MISCELLANEOUS) ×4 IMPLANT
PORT APPOLLO RF 90DEGREE MULTI (SURGICAL WAND) IMPLANT
SLING ARM IMMOBILIZER XL (CAST SUPPLIES) ×2 IMPLANT
SPONGE GAUZE 2X2 8PLY STRL LF (GAUZE/BANDAGES/DRESSINGS) ×1 IMPLANT
SUT ETHILON 2 0 FS 18 (SUTURE) ×2 IMPLANT
TAPE CLOTH SURG 4X10 WHT LF (GAUZE/BANDAGES/DRESSINGS) ×2 IMPLANT
TOWEL GREEN STERILE FF (TOWEL DISPOSABLE) ×4 IMPLANT
TUBING ARTHROSCOPY IRRIG 16FT (MISCELLANEOUS) ×2 IMPLANT
WATER STERILE IRR 1000ML POUR (IV SOLUTION) ×2 IMPLANT

## 2021-01-21 NOTE — Transfer of Care (Signed)
Immediate Anesthesia Transfer of Care Note  Patient: Paul Hicks  Procedure(s) Performed: LEFT SHOULDER ARTHROSCOPY AND DEBRIDEMENT (Left: Shoulder)  Patient Location: PACU  Anesthesia Type:General  Level of Consciousness: drowsy  Airway & Oxygen Therapy: Patient Spontanous Breathing and Patient connected to face mask oxygen  Post-op Assessment: Report given to RN and Post -op Vital signs reviewed and stable  Post vital signs: Reviewed and stable  Last Vitals:  Vitals Value Taken Time  BP 118/58 01/21/21 0842  Temp    Pulse 57 01/21/21 0842  Resp 15 01/21/21 0842  SpO2 95 % 01/21/21 0842  Vitals shown include unvalidated device data.  Last Pain:  Vitals:   01/21/21 0607  TempSrc:   PainSc: 5       Patients Stated Pain Goal: 0 (04/05/15 3567)  Complications: No notable events documented.

## 2021-01-21 NOTE — Anesthesia Procedure Notes (Signed)
Anesthesia Regional Block: Interscalene brachial plexus block   Pre-Anesthetic Checklist: , timeout performed,  Correct Patient, Correct Site, Correct Laterality,  Correct Procedure, Correct Position, site marked,  Risks and benefits discussed,  Surgical consent,  Pre-op evaluation,  At surgeon's request and post-op pain management  Laterality: Left  Prep: Maximum Sterile Barrier Precautions used, chloraprep       Needles:  Injection technique: Single-shot  Needle Type: Echogenic Stimulator Needle     Needle Length: 9cm  Needle Gauge: 22     Additional Needles:   Procedures:,,,, ultrasound used (permanent image in chart),,    Narrative:  Start time: 01/21/2021 7:10 AM End time: 01/21/2021 7:15 AM Injection made incrementally with aspirations every 5 mL.  Performed by: Personally  Anesthesiologist: Pervis Hocking, DO  Additional Notes: Monitors applied. No increased pain on injection. No increased resistance to injection. Injection made in 5cc increments. Good needle visualization. Patient tolerated procedure well.

## 2021-01-21 NOTE — Op Note (Signed)
01/21/2021  8:51 AM  PATIENT:  Paul Hicks    PRE-OPERATIVE DIAGNOSIS:  Left Shoulder Impingement  POST-OPERATIVE DIAGNOSIS:  Same  PROCEDURE:  LEFT SHOULDER ARTHROSCOPY AND DEBRIDEMENT  SURGEON:  Newt Minion, MD  PHYSICIAN ASSISTANT:None ANESTHESIA:   General  PREOPERATIVE INDICATIONS:  EFFREY DAVIDOW is a  70 y.o. male with a diagnosis of Left Shoulder Impingement who failed conservative measures and elected for surgical management.    The risks benefits and alternatives were discussed with the patient preoperatively including but not limited to the risks of infection, bleeding, nerve injury, cardiopulmonary complications, the need for revision surgery, among others, and the patient was willing to proceed.  OPERATIVE IMPLANTS: None  @ENCIMAGES @  OPERATIVE FINDINGS: Impingement with labral tear partial biceps tendon tear and partial tearing of the rotator cuff.  OPERATIVE PROCEDURE: Patient underwent interscalene block and then was brought the operating room.  Patient underwent a general anesthetic.  After adequate levels anesthesia were obtained patient was placed in the beachchair position the left upper extremity was prepped using DuraPrep draped into a sterile field a timeout was called.  The scope was inserted from the posterior portal and anterior portal established with an outside in technique with an 18-gauge spinal.  Visualization showed partial tearing approximately 10% of the biceps tendon this was debrided.  Patient had a labral tear and underwent debridement of the SLAP lesion.  There is osteochondral changes of the glenoid and humeral head and this was debrided back to viable stable subchondral bone.  Patient had partial tearing the rotator cuff and using the vapor wand and shaver this was debrided back to a stable viable margin.  There was good attachment at the insertion of the humeral head.  The scope and instruments were removed the scope was then inserted from the  posterior portal and subacromial space and a new lateral portal was established.  Patient had impingement and synovitis.  Patient underwent bursectomy the electrical wand was used hemostasis patient underwent subacromial decompression with the shaver.  The remainder of the rotator cuff was intact and viable.  The instruments were removed the portals closed using 2-0 nylon sterile dressing was applied patient was extubated taken the PACU in stable condition placed in a sling.   DISCHARGE PLANNING:  Antibiotic duration: Preoperative antibiotics  Weightbearing: Not applicable  Pain medication: Prescription for Vicodin  Dressing care/ Wound VAC: Change dressing in 2 days  Ambulatory devices: Not applicable  Discharge to: Home.  Follow-up: In the office 1 week post operative.

## 2021-01-21 NOTE — Anesthesia Procedure Notes (Signed)
Procedure Name: Intubation Date/Time: 01/21/2021 7:44 AM Performed by: Vonna Drafts, CRNA Pre-anesthesia Checklist: Patient identified, Emergency Drugs available, Suction available and Patient being monitored Patient Re-evaluated:Patient Re-evaluated prior to induction Oxygen Delivery Method: Circle system utilized Preoxygenation: Pre-oxygenation with 100% oxygen Induction Type: IV induction Ventilation: Two handed mask ventilation required Laryngoscope Size: Glidescope and 4 Grade View: Grade I Tube type: Oral Tube size: 7.5 mm Number of attempts: 1 Airway Equipment and Method: Video-laryngoscopy and Rigid stylet Placement Confirmation: ETT inserted through vocal cords under direct vision, positive ETCO2 and breath sounds checked- equal and bilateral Secured at: 24 cm Tube secured with: Tape Dental Injury: Teeth and Oropharynx as per pre-operative assessment  Difficulty Due To: Difficulty was unanticipated

## 2021-01-21 NOTE — Anesthesia Postprocedure Evaluation (Signed)
Anesthesia Post Note  Patient: Paul Hicks  Procedure(s) Performed: LEFT SHOULDER ARTHROSCOPY AND DEBRIDEMENT (Left: Shoulder)     Patient location during evaluation: PACU Anesthesia Type: Regional and General Level of consciousness: awake and alert, oriented and patient cooperative Pain management: pain level controlled Vital Signs Assessment: post-procedure vital signs reviewed and stable Respiratory status: spontaneous breathing, nonlabored ventilation and respiratory function stable Cardiovascular status: blood pressure returned to baseline and stable Postop Assessment: no apparent nausea or vomiting Anesthetic complications: no   No notable events documented.  Last Vitals:  Vitals:   01/21/21 0548 01/21/21 0843  BP: (!) 173/65 (!) 118/58  Pulse: 61 (!) 57  Resp: 18 15  Temp: 36.9 C (!) 36.1 C  SpO2: 96% 95%    Last Pain:  Vitals:   01/21/21 0843  TempSrc:   PainSc: [place]

## 2021-01-21 NOTE — H&P (Signed)
Paul Hicks is an 70 y.o. male.   Chief Complaint: Left shoulder pain. HPI: Patient presents in follow-up today for his left shoulder.  He is left-handed and he has been having increasing pain in his shoulder.  His affect his daily activities such as driving and walking.  He states when he takes his trash can to the curb and comes back his shoulder is quite sore.  He has failed conservative treatment including medication, injections, and physical therapy.  He has been doing a home exercise program that was taught to him by therapy.  He has not seen any change in his symptoms.  He points to the systems is going down the back of his arm and from his shoulder into his chest.  He did see his primary care provider who did not find any kind of cardiac reasoning for this pain.  He has a very remote history of a shoulder arthroscopy in the past and he did very well for a long time  Past Medical History:  Diagnosis Date   Allergy    Pollen - no meds    Anxiety    Arthritis    left shoulder, neck, back    Cataract    removed both eyes    Diabetes mellitus without complication ([place])    On Metformin    GERD (gastroesophageal reflux disease)    History of adenomatous polyp of colon 07/30/2014   Hypertension    Polyp of colon     Past Surgical History:  Procedure Laterality Date   CATARACT EXTRACTION Bilateral    COLONOSCOPY     COLONOSCOPY N/A 02/11/2015   Procedure: COLONOSCOPY;  Surgeon: Gatha Mayer, MD;  Location: [facility];  Service: Endoscopy;  Laterality: N/A;   COLONOSCOPY WITH PROPOFOL N/A 10/07/2014   Procedure: COLONOSCOPY WITH PROPOFOL;  Surgeon: Inda Castle, MD;  Location: [facility];  Service: Endoscopy;  Laterality: N/A;   COLONOSCOPY WITH PROPOFOL N/A 11/04/2015   Procedure: COLONOSCOPY WITH PROPOFOL;  Surgeon: Gatha Mayer, MD;  Location: [facility];  Service: Endoscopy;  Laterality: N/A;   ESOPHAGOGASTRODUODENOSCOPY  10/02/2020   hyperplastic gastric polyp   HOT  HEMOSTASIS N/A 10/07/2014   Procedure: HOT HEMOSTASIS (ARGON PLASMA COAGULATION/BICAP);  Surgeon: Inda Castle, MD;  Location: [facility];  Service: Endoscopy;  Laterality: N/A;   HOT HEMOSTASIS N/A 02/11/2015   Procedure: HOT HEMOSTASIS (ARGON PLASMA COAGULATION/BICAP);  Surgeon: Gatha Mayer, MD;  Location: [facility];  Service: Endoscopy;  Laterality: N/A;   right foot     SHOULDER ARTHROSCOPY W/ ROTATOR CUFF REPAIR     both shoulders    Family History  Problem Relation Age of Onset   Colon cancer Brother    Colon polyps Brother    Esophageal cancer Neg Hx    Rectal cancer Neg Hx    Stomach cancer Neg Hx    Social History:  reports that he quit smoking about 40 years ago. His smoking use included cigarettes. He has never used smokeless tobacco. He reports current alcohol use of about 1.0 standard drink per week. He reports that he does not use drugs.  Allergies: No Known Allergies  Medications Prior to Admission  Medication Sig Dispense Refill   aspirin EC 81 MG tablet Take 1 tablet (81 mg total) by mouth daily. Swallow whole. 90 tablet 3   cloNIDine (CATAPRES) 0.2 MG tablet Take 1 tablet (0.2 mg total) by mouth 2 (two) times daily. 180 tablet 3   cyanocobalamin 1000  MCG tablet Take 1,000 mcg by mouth daily.     diltiazem (CARDIZEM CD) 300 MG 24 hr capsule Take 300 mg by mouth daily.     ferrous sulfate 325 (65 FE) MG EC tablet Take 1 tablet (325 mg total) by mouth daily with breakfast.  3   FLUoxetine (PROZAC) 20 MG capsule Take 20 mg by mouth daily.     metFORMIN (GLUCOPHAGE-XR) 750 MG 24 hr tablet Take 1,500 mg by mouth daily with breakfast.     Omega-3 Fatty Acids (FISH OIL) 1000 MG CAPS Take 1,000 mg by mouth daily.     omeprazole (PRILOSEC) 40 MG capsule Take 40 mg by mouth daily.     rosuvastatin (CRESTOR) 40 MG tablet TAKE 1 TABLET BY MOUTH EVERY DAY 90 tablet 3   valsartan-hydrochlorothiazide (DIOVAN-HCT) 320-25 MG tablet Take 1 tablet by mouth daily.     Blood  Glucose Monitoring Suppl (ONE TOUCH ULTRA 2) w/Device KIT USE AS DIRECTED     glucose blood test strip USE AS DIRECTED ONCE DAILY     OneTouch Delica Lancets 16X MISC TEST DAILY      Results for orders placed or performed during the hospital encounter of 01/21/21 (from the past 48 hour(s))  Glucose, capillary     Status: Abnormal   Collection Time: 01/21/21  5:47 AM  Result Value Ref Range   Glucose-Capillary 167 (H) 70 - 99 mg/dL    Comment: Glucose reference range applies only to samples taken after fasting for at least 8 hours.   No results found.  Review of Systems  All other systems reviewed and are negative.  Blood pressure (!) 173/65, pulse 61, temperature 98.5 F (36.9 C), temperature source Oral, resp. rate 18, height 6' (1.829 m), weight 122.9 kg, SpO2 96 %. Physical Exam  Patient is alert oriented no adenopathy well-dressed normal affect normal respiratory effort.  Patient has elevation to 120 degrees.  He has pain with Neer and Hawkins impingement test. Assessment/Plan Assessment: Impingement syndrome left shoulder with rotator cuff pathology.  Plan: Patient wishes to proceed with left shoulder arthroscopy with debridement and decompression.  Risk and benefits were discussed including persistent pain need for additional surgery.  Patient states he understands wished to proceed at this time.  Newt Minion, MD 01/21/2021, 6:32 AM

## 2021-01-21 NOTE — Progress Notes (Signed)
Orthopedic Tech Progress Note Patient Details:  Paul Hicks 08/12/1950 [id number]  PACU RN called requesting an ARM SLING for patient   Ortho Devices Type of Ortho Device: Arm sling Ortho Device/Splint Location: LUE Ortho Device/Splint Interventions: Ordered   Post Interventions Patient Tolerated: Well Instructions Provided: Care of device  Janit Pagan 01/21/2021, 11:42 AM

## 2021-01-22 ENCOUNTER — Encounter (HOSPITAL_COMMUNITY): Payer: Self-pay | Admitting: Orthopedic Surgery

## 2021-01-27 ENCOUNTER — Ambulatory Visit (INDEPENDENT_AMBULATORY_CARE_PROVIDER_SITE_OTHER): Payer: Medicare HMO | Admitting: Orthopedic Surgery

## 2021-01-27 ENCOUNTER — Other Ambulatory Visit: Payer: Self-pay

## 2021-01-27 DIAGNOSIS — M75112 Incomplete rotator cuff tear or rupture of left shoulder, not specified as traumatic: Secondary | ICD-10-CM

## 2021-01-29 NOTE — Progress Notes (Signed)
HPI:FU CAD. Also with h/o of diabetes mellitus, hypertension and hyperlipidemia. Abdominal ultrasound January 2019 showed no aneurysm. CTA November 14, 1999 showed nonobstructive disease other than 50 to 69% stenosis in the mid LAD. Calcium score 286.  FFR 0.77.  Follow-up nuclear study June 2022 showed ejection fraction 63% and normal perfusion.  Since last seen the patient has dyspnea with more extreme activities but not with routine activities. It is relieved with rest. It is not associated with chest pain. There is no orthopnea, PND or pedal edema. There is no syncope or palpitations. There is no exertional chest pain.   Current Outpatient Medications  Medication Sig Dispense Refill   aspirin EC 81 MG tablet Take 1 tablet (81 mg total) by mouth daily. Swallow whole. 90 tablet 3   Blood Glucose Monitoring Suppl (ONE TOUCH ULTRA 2) w/Device KIT USE AS DIRECTED     cloNIDine (CATAPRES) 0.2 MG tablet Take 1 tablet (0.2 mg total) by mouth 2 (two) times daily. 180 tablet 3   cyanocobalamin 1000 MCG tablet Take 1,000 mcg by mouth daily.     diltiazem (CARDIZEM CD) 300 MG 24 hr capsule Take 300 mg by mouth daily.     ferrous sulfate 325 (65 FE) MG EC tablet Take 1 tablet (325 mg total) by mouth daily with breakfast.  3   FLUoxetine (PROZAC) 20 MG capsule Take 20 mg by mouth daily.     glucose blood test strip USE AS DIRECTED ONCE DAILY     metFORMIN (GLUCOPHAGE-XR) 750 MG 24 hr tablet Take 1,500 mg by mouth daily with breakfast.     Omega-3 Fatty Acids (FISH OIL) 1000 MG CAPS Take 1,000 mg by mouth daily.     omeprazole (PRILOSEC) 40 MG capsule Take 40 mg by mouth daily.     OneTouch Delica Lancets 56Y MISC TEST DAILY     rosuvastatin (CRESTOR) 40 MG tablet TAKE 1 TABLET BY MOUTH EVERY DAY 90 tablet 3   valsartan-hydrochlorothiazide (DIOVAN-HCT) 320-25 MG tablet Take 1 tablet by mouth daily.     No current facility-administered medications for this visit.     Past Medical History:   Diagnosis Date   Allergy    Pollen - no meds    Anxiety    Arthritis    left shoulder, neck, back    Cataract    removed both eyes    Diabetes mellitus without complication ([facility])    On Metformin    GERD (gastroesophageal reflux disease)    History of adenomatous polyp of colon 07/30/2014   Hypertension    Polyp of colon     Past Surgical History:  Procedure Laterality Date   CATARACT EXTRACTION Bilateral    COLONOSCOPY     COLONOSCOPY N/A 02/11/2015   Procedure: COLONOSCOPY;  Surgeon: Gatha Mayer, MD;  Location: [facility];  Service: Endoscopy;  Laterality: N/A;   COLONOSCOPY WITH PROPOFOL N/A 10/07/2014   Procedure: COLONOSCOPY WITH PROPOFOL;  Surgeon: Inda Castle, MD;  Location: [facility];  Service: Endoscopy;  Laterality: N/A;   COLONOSCOPY WITH PROPOFOL N/A 11/04/2015   Procedure: COLONOSCOPY WITH PROPOFOL;  Surgeon: Gatha Mayer, MD;  Location: [facility];  Service: Endoscopy;  Laterality: N/A;   ESOPHAGOGASTRODUODENOSCOPY  10/02/2020   hyperplastic gastric polyp   HOT HEMOSTASIS N/A 10/07/2014   Procedure: HOT HEMOSTASIS (ARGON PLASMA COAGULATION/BICAP);  Surgeon: Inda Castle, MD;  Location: [facility];  Service: Endoscopy;  Laterality: N/A;   HOT HEMOSTASIS N/A 02/11/2015  Procedure: HOT HEMOSTASIS (ARGON PLASMA COAGULATION/BICAP);  Surgeon: Gatha Mayer, MD;  Location: [facility];  Service: Endoscopy;  Laterality: N/A;   right foot     SHOULDER ARTHROSCOPY Left 01/21/2021   Procedure: LEFT SHOULDER ARTHROSCOPY AND DEBRIDEMENT;  Surgeon: Newt Minion, MD;  Location: [facility];  Service: Orthopedics;  Laterality: Left;   SHOULDER ARTHROSCOPY W/ ROTATOR CUFF REPAIR     both shoulders    Social History   Socioeconomic History   Marital status: Married    Spouse name: Not on file   Number of children: 2   Years of education: Not on file   Highest education level: Not on file  Occupational History   Not on file  Tobacco Use   Smoking  status: Former    Types: Cigarettes    Quit date: 10/19/1980    Years since quitting: 40.3   Smokeless tobacco: Never  Vaping Use   Vaping Use: Never used  Substance and Sexual Activity   Alcohol use: Yes    Alcohol/week: 1.0 standard drink    Types: 1 Cans of beer per week    Comment: occasionally   Drug use: No   Sexual activity: Not on file  Other Topics Concern   Not on file  Social History Narrative   Not on file   Social Determinants of Health   Financial Resource Strain: Not on file  Food Insecurity: Not on file  Transportation Needs: Not on file  Physical Activity: Not on file  Stress: Not on file  Social Connections: Not on file  Intimate Partner Violence: Not on file    Family History  Problem Relation Age of Onset   Colon cancer Brother    Colon polyps Brother    Esophageal cancer Neg Hx    Rectal cancer Neg Hx    Stomach cancer Neg Hx     ROS: no fevers or chills, productive cough, hemoptysis, dysphasia, odynophagia, melena, hematochezia, dysuria, hematuria, rash, seizure activity, orthopnea, PND, pedal edema, claudication. Remaining systems are negative.  Physical Exam: Well-developed well-nourished in no acute distress.  Skin is warm and dry.  HEENT is normal.  Neck is supple.  Chest is clear to auscultation with normal expansion.  Cardiovascular exam is regular rate and rhythm.  Abdominal exam nontender or distended. No masses palpated. Extremities show no edema. neuro grossly intact   A/P  1 coronary artery disease-patient denies recurrent chest pain.  As outlined above previous CTA demonstrated moderate stenosis of the mid LAD.  He has been asymptomatic and we are therefore treating medically.  Continue aspirin and statin.  2 hypertension-patient's blood pressure is elevated; however he has not taken his medications this morning.  He states it is typically controlled.  We will follow and advance regimen as needed.  Check potassium and renal  function.  3 hyperlipidemia-continue statin.  Check lipids and liver.  4 morbid obesity-we discussed lifestyle modification.  Kirk Ruths, MD

## 2021-02-05 ENCOUNTER — Other Ambulatory Visit: Payer: Self-pay

## 2021-02-05 ENCOUNTER — Encounter: Payer: Self-pay | Admitting: Cardiology

## 2021-02-05 ENCOUNTER — Ambulatory Visit: Payer: Medicare HMO | Admitting: Cardiology

## 2021-02-05 VITALS — BP 158/72 | HR 71 | Ht 72.0 in | Wt 272.4 lb

## 2021-02-05 DIAGNOSIS — I10 Essential (primary) hypertension: Secondary | ICD-10-CM | POA: Diagnosis not present

## 2021-02-05 DIAGNOSIS — E7800 Pure hypercholesterolemia, unspecified: Secondary | ICD-10-CM | POA: Diagnosis not present

## 2021-02-05 DIAGNOSIS — I2510 Atherosclerotic heart disease of native coronary artery without angina pectoris: Secondary | ICD-10-CM | POA: Diagnosis not present

## 2021-02-05 LAB — LIPID PANEL
Chol/HDL Ratio: 2.8 ratio (ref 0.0–5.0)
Cholesterol, Total: 87 mg/dL — ABNORMAL LOW (ref 100–199)
HDL: 31 mg/dL — ABNORMAL LOW (ref >39)
LDL Chol Calc (NIH): 34 mg/dL (ref 0–99)
Triglycerides: 122 mg/dL (ref 0–149)
VLDL Cholesterol Cal: 22 mg/dL (ref 5–40)

## 2021-02-05 LAB — COMPREHENSIVE METABOLIC PANEL
ALT: 10 IU/L (ref 0–44)
AST: 9 IU/L (ref 0–40)
Albumin/Globulin Ratio: 2.5 — ABNORMAL HIGH (ref 1.2–2.2)
Albumin: 4.7 g/dL (ref 3.7–4.7)
Alkaline Phosphatase: 73 IU/L (ref 44–121)
BUN/Creatinine Ratio: 26 — ABNORMAL HIGH (ref 10–24)
BUN: 20 mg/dL (ref 8–27)
Bilirubin Total: 0.3 mg/dL (ref 0.0–1.2)
CO2: 25 mmol/L (ref 20–29)
Calcium: 9.3 mg/dL (ref 8.6–10.2)
Chloride: 98 mmol/L (ref 96–106)
Creatinine, Ser: 0.76 mg/dL (ref 0.76–1.27)
Globulin, Total: 1.9 g/dL (ref 1.5–4.5)
Glucose: 141 mg/dL — ABNORMAL HIGH (ref 70–99)
Potassium: 4.1 mmol/L (ref 3.5–5.2)
Sodium: 138 mmol/L (ref 134–144)
Total Protein: 6.6 g/dL (ref 6.0–8.5)
eGFR: 95 mL/min/{1.73_m2} (ref >59)

## 2021-02-05 NOTE — Patient Instructions (Signed)

## 2021-02-11 ENCOUNTER — Encounter: Payer: Self-pay | Admitting: Orthopedic Surgery

## 2021-02-11 NOTE — Progress Notes (Signed)
Office Visit Note   Patient: Paul Hicks           Date of Birth: [birth year]           MRN: [id number] Visit Date: 01/27/2021              Requested by: Chesley Noon, MD [address] [place],  [place] [address] PCP: Chesley Noon, MD  Chief Complaint  Patient presents with   Left Shoulder - Routine Post Op    01/21/21 left shoulder scope and debridement       HPI: Patient is a 70 year old gentleman who presents 1 week status post left shoulder arthroscopy debridement and decompression.  Patient states he feels well except with overhead movement.  He states he cannot go over above his shoulders at this time.  Assessment & Plan: Visit Diagnoses:  1. Nontraumatic incomplete tear of left rotator cuff     Plan: Patient states he is not interested in physical therapy he will increase his activities and strength as tolerated.  Follow-Up Instructions: Return if symptoms worsen or fail to improve.   Ortho Exam  Patient is alert, oriented, no adenopathy, well-dressed, normal affect, normal respiratory effort. Examination the portals are clean and dry there is no redness no cellulitis no drainage.  Imaging: No results found. No images are attached to the encounter.  Labs: No results found for: HGBA1C, ESRSEDRATE, CRP, LABURIC, REPTSTATUS, GRAMSTAIN, CULT, LABORGA   Lab Results  Component Value Date   ALBUMIN 4.7 02/05/2021   ALBUMIN 4.5 02/04/2020   ALBUMIN 4.5 11/08/2019    No results found for: MG No results found for: VD25OH  No results found for: PREALBUMIN CBC EXTENDED Latest Ref Rng & Units 01/21/2021 01/19/2010  WBC 4.0 - 10.5 K/uL 8.5 9.9  RBC 4.22 - 5.81 MIL/uL 5.58 6.12(H)  HGB 13.0 - 17.0 g/dL 14.5 17.5(H)  HCT 39.0 - 52.0 % 45.7 51.6  PLT 150 - 400 K/uL 228 216  NEUTROABS 1.7 - 7.7 K/uL - 8.4(H)  LYMPHSABS 0.7 - 4.0 K/uL - 0.8     There is no height or weight on file to calculate BMI.  Orders:  No orders of the defined types were  placed in this encounter.  No orders of the defined types were placed in this encounter.    Procedures: No procedures performed  Clinical Data: No additional findings.  ROS:  All other systems negative, except as noted in the HPI. Review of Systems  Objective: Vital Signs: There were no vitals taken for this visit.  Specialty Comments:  No specialty comments available.  PMFS History: Patient Active Problem List   Diagnosis Date Noted   Impingement syndrome of left shoulder    Incomplete rotatr-cuff tear/ruptr of l shoulder, not trauma    Diverticulosis of colon without hemorrhage    Family history of colon cancer - brother 02/11/2015   History of adenomatous polyp of colon 07/30/2014   Past Medical History:  Diagnosis Date   Allergy    Pollen - no meds    Anxiety    Arthritis    left shoulder, neck, back    Cataract    removed both eyes    Diabetes mellitus without complication ([facility])    On Metformin    GERD (gastroesophageal reflux disease)    History of adenomatous polyp of colon 07/30/2014   Hypertension    Polyp of colon     Family History  Problem Relation Age of Onset  Colon cancer Brother    Colon polyps Brother    Esophageal cancer Neg Hx    Rectal cancer Neg Hx    Stomach cancer Neg Hx     Past Surgical History:  Procedure Laterality Date   CATARACT EXTRACTION Bilateral    COLONOSCOPY     COLONOSCOPY N/A 02/11/2015   Procedure: COLONOSCOPY;  Surgeon: Gatha Mayer, MD;  Location: [facility];  Service: Endoscopy;  Laterality: N/A;   COLONOSCOPY WITH PROPOFOL N/A 10/07/2014   Procedure: COLONOSCOPY WITH PROPOFOL;  Surgeon: Inda Castle, MD;  Location: [facility];  Service: Endoscopy;  Laterality: N/A;   COLONOSCOPY WITH PROPOFOL N/A 11/04/2015   Procedure: COLONOSCOPY WITH PROPOFOL;  Surgeon: Gatha Mayer, MD;  Location: [facility];  Service: Endoscopy;  Laterality: N/A;   ESOPHAGOGASTRODUODENOSCOPY  10/02/2020   hyperplastic gastric  polyp   HOT HEMOSTASIS N/A 10/07/2014   Procedure: HOT HEMOSTASIS (ARGON PLASMA COAGULATION/BICAP);  Surgeon: Inda Castle, MD;  Location: [facility];  Service: Endoscopy;  Laterality: N/A;   HOT HEMOSTASIS N/A 02/11/2015   Procedure: HOT HEMOSTASIS (ARGON PLASMA COAGULATION/BICAP);  Surgeon: Gatha Mayer, MD;  Location: [facility];  Service: Endoscopy;  Laterality: N/A;   right foot     SHOULDER ARTHROSCOPY Left 01/21/2021   Procedure: LEFT SHOULDER ARTHROSCOPY AND DEBRIDEMENT;  Surgeon: Newt Minion, MD;  Location: [facility];  Service: Orthopedics;  Laterality: Left;   SHOULDER ARTHROSCOPY W/ ROTATOR CUFF REPAIR     both shoulders   Social History   Occupational History   Not on file  Tobacco Use   Smoking status: Former    Types: Cigarettes    Quit date: 10/19/1980    Years since quitting: 40.3   Smokeless tobacco: Never  Vaping Use   Vaping Use: Never used  Substance and Sexual Activity   Alcohol use: Yes    Alcohol/week: 1.0 standard drink    Types: 1 Cans of beer per week    Comment: occasionally   Drug use: No   Sexual activity: Not on file

## 2021-07-02 ENCOUNTER — Other Ambulatory Visit: Payer: Self-pay | Admitting: Cardiology

## 2021-09-10 ENCOUNTER — Other Ambulatory Visit: Payer: Self-pay | Admitting: Cardiology

## 2021-09-10 DIAGNOSIS — R072 Precordial pain: Secondary | ICD-10-CM

## 2021-09-15 ENCOUNTER — Encounter: Payer: Self-pay | Admitting: *Deleted

## 2021-11-26 ENCOUNTER — Other Ambulatory Visit: Payer: Self-pay | Admitting: Cardiology

## 2021-12-29 ENCOUNTER — Other Ambulatory Visit: Payer: Self-pay | Admitting: Cardiology

## 2021-12-29 DIAGNOSIS — R072 Precordial pain: Secondary | ICD-10-CM

## 2022-03-10 ENCOUNTER — Other Ambulatory Visit: Payer: Self-pay | Admitting: Cardiology

## 2022-03-24 IMAGING — MR MR SHOULDER*L* W/O CM
5 series · 34 of 40 positions shown · non-contrast
Comparison: None.

CLINICAL DATA: Left shoulder pain extending down the left arm.
Anterior chest pain.

EXAM:
MRI OF THE LEFT SHOULDER WITHOUT CONTRAST
TECHNIQUE: Multiplanar, multisequence MR imaging of the shoulder was performed.
No intravenous contrast was administered.

[Series 5: PD · oblique · 4.0mm · 0.27mm/px · 7 of 19 slices shown]
[im 1/19]
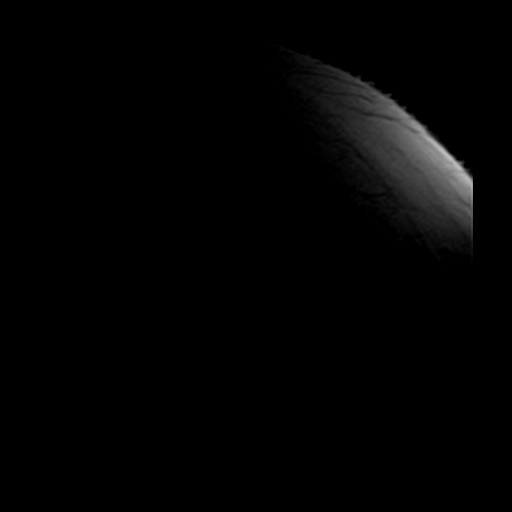
[im 4/19]
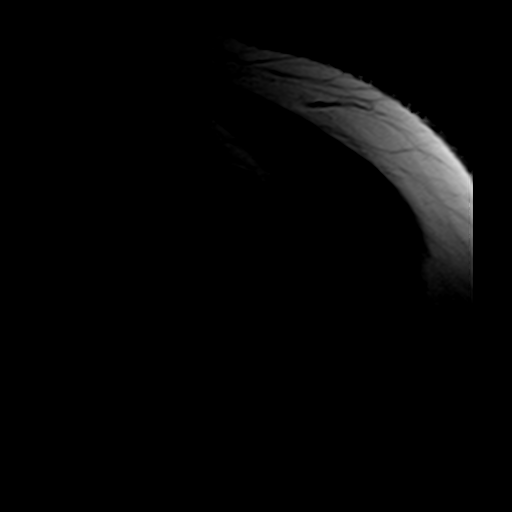
[im 7/19]
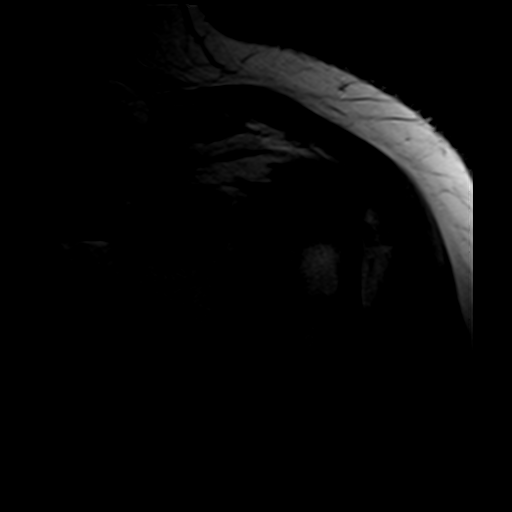
[im 10/19]
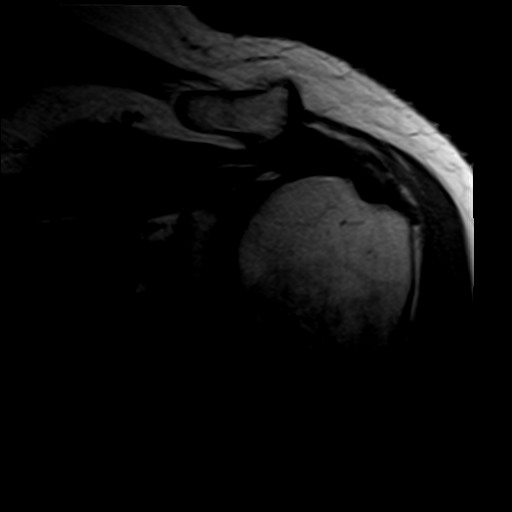
[im 13/19]
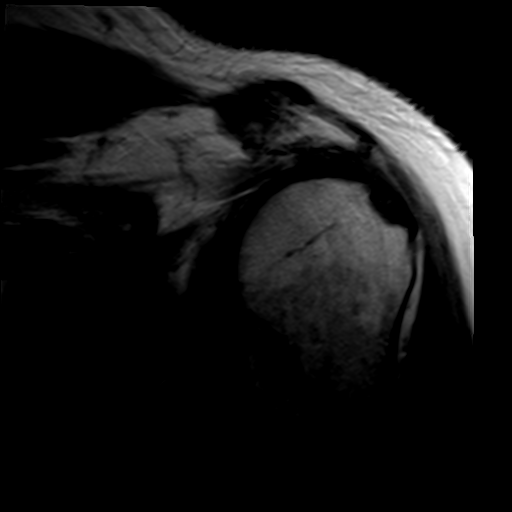
[im 16/19]
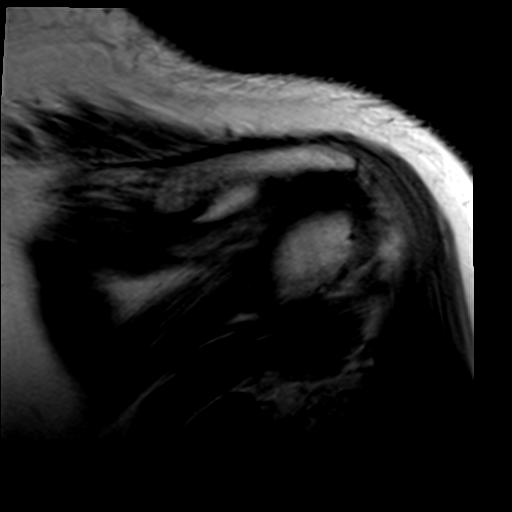
[im 19/19]
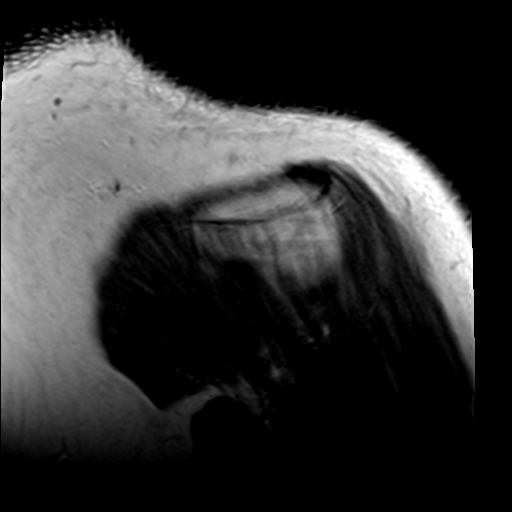

[Series 6: T2 fat-sat · oblique · 4.0mm · 0.55mm/px · 8 of 25 slices shown (1 of 3)]
[im 1/25]
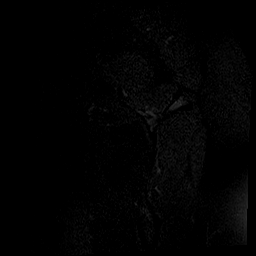
[im 4/25]
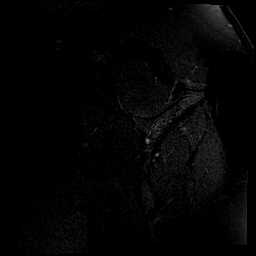
[im 7/25]
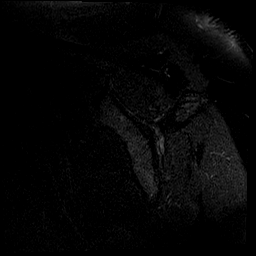
[im 11/25]
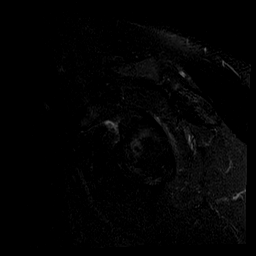
[im 14/25]
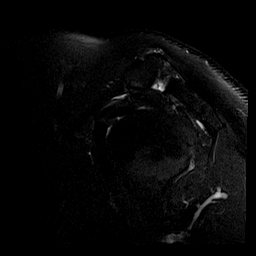
[im 18/25]
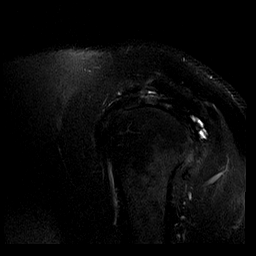
[im 21/25]
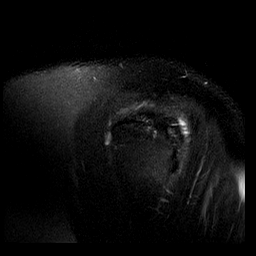
[im 25/25]
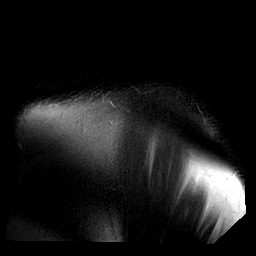

[Series 7: T2 fat-sat · axial · 4.0mm · 0.62mm/px · z∈[-120,+1]mm · 8 of 29 slices shown (2 of 3)]
[im 1/29]
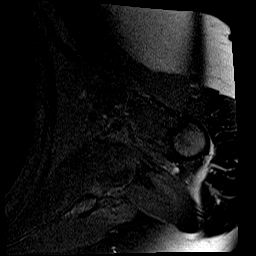
[im 4/29]
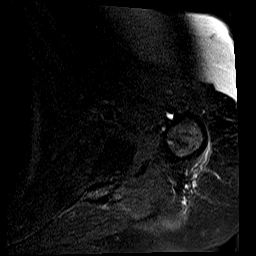
[im 10/29]
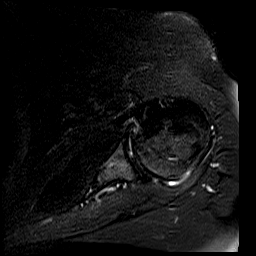
[im 13/29]
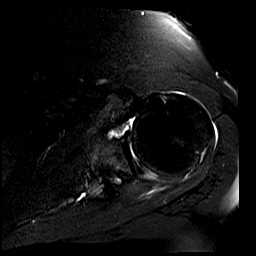
[im 16/29]
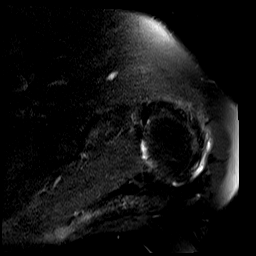
[im 19/29]
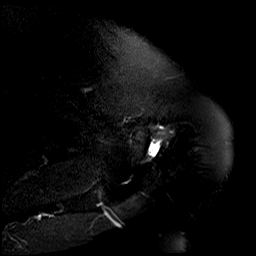
[im 25/29]
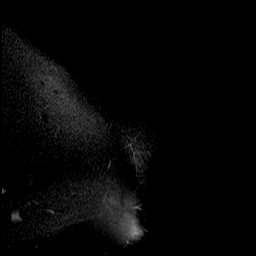
[im 29/29]
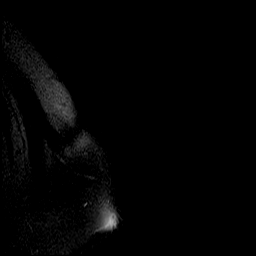

[Series 8: T1 · oblique · 4.0mm · 0.27mm/px · 5 of 28 slices shown]
[im 1/28]
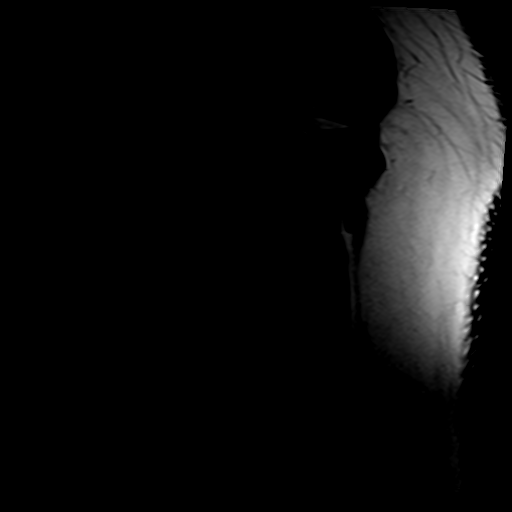
[im 4/28]
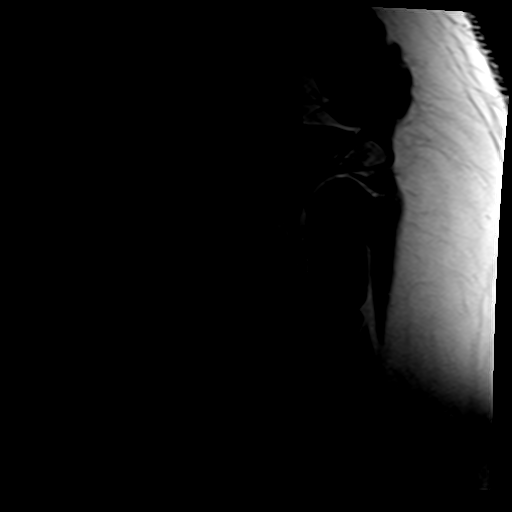
[im 7/28]
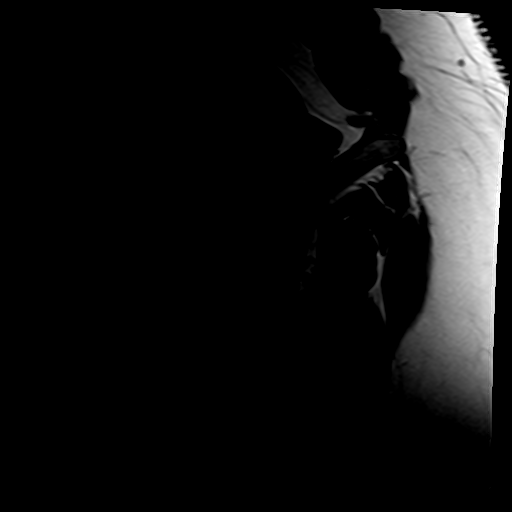
[im 11/28]
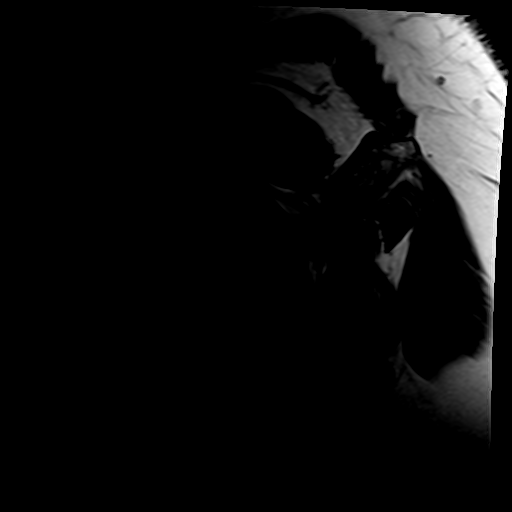
[im 17/28]
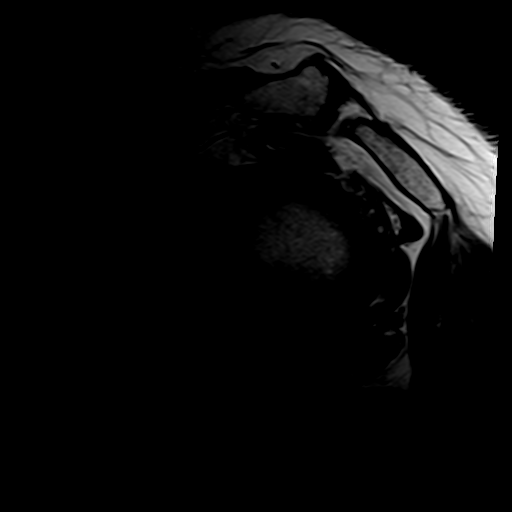

[Series 9: T2 fat-sat · oblique · 4.0mm · 0.62mm/px · 6 of 19 slices shown (3 of 3)]
[im 1/19]
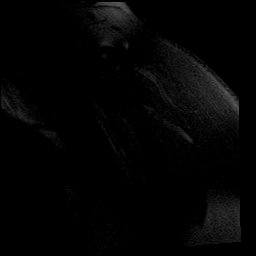
[im 4/19]
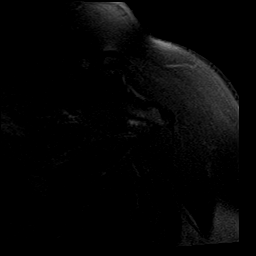
[im 8/19]
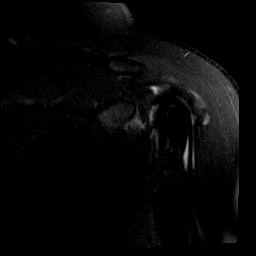
[im 11/19]
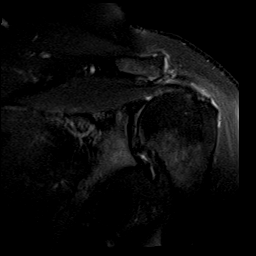
[im 15/19]
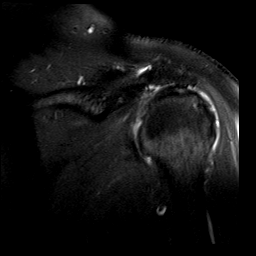
[im 19/19]
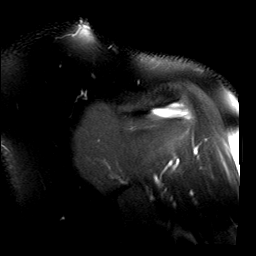

[34 of 40 positions shown; findings below may reference images not displayed]

FINDINGS: Rotator cuff: Mild tendinosis of the supraspinatus tendon and
infraspinatus tendon with a partial-thickness bursal surface tear at
the junction of the supraspinatus and infraspinatus tendons. Teres
minor tendon is intact. Subscapularis tendon is intact.

Muscles: No muscle atrophy or edema. No intramuscular fluid
collection or hematoma.

Biceps Long Head: Intraarticular and extraarticular portions of the
biceps tendon are intact.

Acromioclavicular Joint: Severe arthropathy of the acromioclavicular
joint. Small amount of subacromial/subdeltoid bursal fluid.

Glenohumeral Joint: No joint effusion. Partial-thickness cartilage
loss of the glenohumeral joint.

Labrum: Grossly intact, but evaluation is limited by lack of
intraarticular fluid/contrast.

Bones: No fracture or dislocation. No aggressive osseous lesion.

Other: No fluid collection or hematoma.
IMPRESSION: 1. Mild tendinosis of the supraspinatus tendon and infraspinatus
tendon with a partial-thickness bursal surface tear at the junction
of the supraspinatus and infraspinatus tendons.
2. Mild subacromial/subdeltoid bursitis.

## 2022-03-25 ENCOUNTER — Other Ambulatory Visit: Payer: Self-pay | Admitting: Cardiology

## 2022-03-25 DIAGNOSIS — R072 Precordial pain: Secondary | ICD-10-CM

## 2022-05-17 ENCOUNTER — Other Ambulatory Visit: Payer: Self-pay | Admitting: Cardiology

## 2022-05-17 DIAGNOSIS — R072 Precordial pain: Secondary | ICD-10-CM

## 2022-05-25 NOTE — Progress Notes (Signed)
HPI: FU CAD. Also with h/o of diabetes mellitus, hypertension and hyperlipidemia. Abdominal ultrasound January 2019 showed no aneurysm. CTA November 14, 1999 showed nonobstructive disease other than 50 to 69% stenosis in the mid LAD. Calcium score 286.  FFR 0.77.  Follow-up nuclear study June 2022 showed ejection fraction 63% and normal perfusion.  Since last seen the patient denies any dyspnea on exertion, orthopnea, PND, pedal edema, palpitations, syncope or chest pain.   Current Outpatient Medications  Medication Sig Dispense Refill   aspirin EC 81 MG tablet Take 1 tablet (81 mg total) by mouth daily. Swallow whole. 90 tablet 3   Blood Glucose Monitoring Suppl (ONE TOUCH ULTRA 2) w/Device KIT USE AS DIRECTED     cloNIDine (CATAPRES) 0.2 MG tablet TAKE 1 TABLET BY MOUTH TWICE A DAY 180 tablet 3   diltiazem (CARDIZEM CD) 300 MG 24 hr capsule Take 300 mg by mouth daily.     FARXIGA 10 MG TABS tablet Take 10 mg by mouth daily.     ferrous sulfate 325 (65 FE) MG EC tablet Take 1 tablet (325 mg total) by mouth daily with breakfast.  3   FLUoxetine (PROZAC) 20 MG capsule Take 20 mg by mouth daily.     glucose blood test strip USE AS DIRECTED ONCE DAILY     Omega-3 Fatty Acids (FISH OIL) 1000 MG CAPS Take 1,000 mg by mouth daily.     omeprazole (PRILOSEC) 40 MG capsule Take 40 mg by mouth daily.     OneTouch Delica Lancets 33G MISC TEST DAILY     rosuvastatin (CRESTOR) 40 MG tablet Take 1 tablet (40 mg total) by mouth daily. Pt needs to call office for an appointment before he can get additional refills x1 90 tablet 0   valsartan-hydrochlorothiazide (DIOVAN-HCT) 320-25 MG tablet Take 1 tablet by mouth daily.     No current facility-administered medications for this visit.     Past Medical History:  Diagnosis Date   Allergy    Pollen - no meds    Anxiety    Arthritis    left shoulder, neck, back    Cataract    removed both eyes    Diabetes mellitus without complication    On  Metformin    GERD (gastroesophageal reflux disease)    History of adenomatous polyp of colon 07/30/2014   Hypertension    Polyp of colon     Past Surgical History:  Procedure Laterality Date   CATARACT EXTRACTION Bilateral    COLONOSCOPY     COLONOSCOPY N/A 02/11/2015   Procedure: COLONOSCOPY;  Surgeon: Iva Booparl E Gessner, MD;  Location: [facility];  Service: Endoscopy;  Laterality: N/A;   COLONOSCOPY WITH PROPOFOL N/A 10/07/2014   Procedure: COLONOSCOPY WITH PROPOFOL;  Surgeon: Louis Meckelobert D Kaplan, MD;  Location: [facility];  Service: Endoscopy;  Laterality: N/A;   COLONOSCOPY WITH PROPOFOL N/A 11/04/2015   Procedure: COLONOSCOPY WITH PROPOFOL;  Surgeon: Iva Booparl E Gessner, MD;  Location: [facility];  Service: Endoscopy;  Laterality: N/A;   ESOPHAGOGASTRODUODENOSCOPY  10/02/2020   hyperplastic gastric polyp   HOT HEMOSTASIS N/A 10/07/2014   Procedure: HOT HEMOSTASIS (ARGON PLASMA COAGULATION/BICAP);  Surgeon: Louis Meckelobert D Kaplan, MD;  [facility];  Service: Endoscopy;  Laterality: N/A;   HOT HEMOSTASIS N/A 02/11/2015   Procedure: HOT HEMOSTASIS (ARGON PLASMA COAGULATION/BICAP);  Surgeon: Iva Booparl E Gessner, MD;  [facility];  Service: Endoscopy;  Laterality: N/A;   right foot     SHOULDER ARTHROSCOPY Left  01/21/2021   Procedure: LEFT SHOULDER ARTHROSCOPY AND DEBRIDEMENT;  Surgeon: Nadara Mustarduda, Marcus V, MD;  [facility];  Service: Orthopedics;  Laterality: Left;   SHOULDER ARTHROSCOPY W/ ROTATOR CUFF REPAIR     both shoulders    Social History   Socioeconomic History   Marital status: Married    Spouse name: Not on file   Number of children: 2   Years of education: Not on file   Highest education level: Not on file  Occupational History   Not on file  Tobacco Use   Smoking status: Former    Types: Cigarettes    Quit date: 10/19/1980    Years since quitting: 41.6   Smokeless tobacco: Never  Vaping Use   Vaping Use: Never used  Substance and Sexual Activity   Alcohol  use: Yes    Alcohol/week: 1.0 standard drink of alcohol    Types: 1 Cans of beer per week    Comment: occasionally   Drug use: No   Sexual activity: Not on file  Other Topics Concern   Not on file  Social History Narrative   Not on file   Social Determinants of Health   Financial Resource Strain: Not on file  Food Insecurity: Not on file  Transportation Needs: Not on file  Physical Activity: Not on file  Stress: Not on file  Social Connections: Not on file  Intimate Partner Violence: Not on file    Family History  Problem Relation Age of Onset   Colon cancer Brother    Colon polyps Brother    Esophageal cancer Neg Hx    Rectal cancer Neg Hx    Stomach cancer Neg Hx     ROS: no fevers or chills, productive cough, hemoptysis, dysphasia, odynophagia, melena, hematochezia, dysuria, hematuria, rash, seizure activity, orthopnea, PND, pedal edema, claudication. Remaining systems are negative.  Physical Exam: Well-developed well-nourished in no acute distress.  Skin is warm and dry.  HEENT is normal.  Neck is supple.  Chest is clear to auscultation with normal expansion.  Cardiovascular exam is regular rate and rhythm.  Abdominal exam nontender or distended. No masses palpated. Extremities show no edema. neuro grossly intact  ECG-normal sinus rhythm with occasional PAC, cannot rule out inferior infarct, no significant ST changes.  Personally reviewed  A/P  1 coronary artery disease-patient has not had chest pain.  Previous CTA showed moderate stenosis of the mid LAD.  Given that he is asymptomatic we are treating medically.  Continue aspirin and statin.  2 hypertension-patient's blood pressure is controlled today.  Continue present medical regimen.  Will obtain most recent potassium and renal function from primary care.  3 hyperlipidemia-continue statin.  Will obtain most recent lipids and liver from primary care.  Olga MillersBrian Ambrielle Kington, MD

## 2022-06-02 ENCOUNTER — Encounter: Payer: Self-pay | Admitting: Cardiology

## 2022-06-02 ENCOUNTER — Ambulatory Visit: Payer: Medicare HMO | Attending: Cardiology | Admitting: Cardiology

## 2022-06-02 VITALS — BP 130/86 | HR 68 | Ht 72.0 in | Wt 249.6 lb

## 2022-06-02 DIAGNOSIS — I10 Essential (primary) hypertension: Secondary | ICD-10-CM

## 2022-06-02 DIAGNOSIS — I2510 Atherosclerotic heart disease of native coronary artery without angina pectoris: Secondary | ICD-10-CM

## 2022-06-02 DIAGNOSIS — E7800 Pure hypercholesterolemia, unspecified: Secondary | ICD-10-CM | POA: Diagnosis not present

## 2022-06-02 NOTE — Patient Instructions (Signed)
    Follow-Up: At [facility], you and your health needs are our priority.  As part of our continuing mission to provide you with exceptional heart care, we have created designated Provider Care Teams.  These Care Teams include your primary Cardiologist (physician) and Advanced Practice Providers (APPs -  Physician Assistants and Nurse Practitioners) who all work together to provide you with the care you need, when you need it.  We recommend signing up for the patient portal called "MyChart".  Sign up information is provided on this After Visit Summary.  MyChart is used to connect with patients for Virtual Visits (Telemedicine).  Patients are able to view lab/test results, encounter notes, upcoming appointments, etc.  Non-urgent messages can be sent to your provider as well.   To learn more about what you can do with MyChart, go to [email or web address].    Your next appointment:   12 month(s)  Provider:   Brian Crenshaw, MD     

## 2022-08-12 ENCOUNTER — Other Ambulatory Visit: Payer: Self-pay | Admitting: Cardiology

## 2022-08-12 DIAGNOSIS — R072 Precordial pain: Secondary | ICD-10-CM

## 2022-08-12 NOTE — Telephone Encounter (Signed)
Rx(s) sent to pharmacy electronically.  

## 2022-12-20 ENCOUNTER — Other Ambulatory Visit: Payer: Self-pay | Admitting: Cardiology

## 2023-03-23 LAB — LAB REPORT - SCANNED
A1c: 6.3
Calcium: 9.2
EGFR: 95

## 2023-07-12 NOTE — Progress Notes (Signed)
 HPI: FU CAD. Also with h/o of diabetes mellitus, hypertension and hyperlipidemia. Abdominal ultrasound January 2019 showed no aneurysm. CTA 9/21 showed nonobstructive disease other than 50 to 69% stenosis in the mid LAD. Calcium  score 286.  FFR 0.77.  Follow-up nuclear study June 2022 showed ejection fraction 63% and normal perfusion.  Since last seen the patient denies any dyspnea on exertion, orthopnea, PND, pedal edema, palpitations, syncope or chest pain.   Current Outpatient Medications  Medication Sig Dispense Refill   aspirin  EC 81 MG tablet Take 1 tablet (81 mg total) by mouth daily. Swallow whole. 90 tablet 3   Blood Glucose Monitoring Suppl (ONE TOUCH ULTRA 2) w/Device KIT USE AS DIRECTED     Cholecalciferol (D3 ADULT PO) Take by mouth.     cloNIDine  (CATAPRES ) 0.2 MG tablet TAKE 1 TABLET BY MOUTH TWICE A DAY 180 tablet 3   Cyanocobalamin (B-12) 1000 MCG CAPS Take by mouth.     diltiazem (CARDIZEM CD) 300 MG 24 hr capsule Take 300 mg by mouth daily.     FARXIGA 10 MG TABS tablet Take 10 mg by mouth daily.     ferrous sulfate  325 (65 FE) MG EC tablet Take 1 tablet (325 mg total) by mouth daily with breakfast.  3   FLUoxetine (PROZAC) 20 MG capsule Take 20 mg by mouth daily.     glucose blood test strip USE AS DIRECTED ONCE DAILY     omeprazole (PRILOSEC) 40 MG capsule Take 40 mg by mouth daily.     OneTouch Delica Lancets 33G MISC TEST DAILY     rosuvastatin  (CRESTOR ) 40 MG tablet Take 1 tablet (40 mg total) by mouth daily. Pt needs to call office for an appointment before he can get additional refills x1 90 tablet 2   valsartan-hydrochlorothiazide (DIOVAN-HCT) 320-25 MG tablet Take 1 tablet by mouth daily.     No current facility-administered medications for this visit.     Past Medical History:  Diagnosis Date   Allergy    Pollen - no meds    Anxiety    Arthritis    left shoulder, neck, back    Cataract    removed both eyes    Diabetes mellitus without complication  (HCC)    On Metformin    GERD (gastroesophageal reflux disease)    History of adenomatous polyp of colon 07/30/2014   Hypertension    Polyp of colon     Past Surgical History:  Procedure Laterality Date   CATARACT EXTRACTION Bilateral    COLONOSCOPY     COLONOSCOPY N/A 02/11/2015   Procedure: COLONOSCOPY;  Surgeon: Kenney Peacemaker, MD;  Location: [facility];  Service: Endoscopy;  Laterality: N/A;   COLONOSCOPY WITH PROPOFOL  N/A 10/07/2014   Procedure: COLONOSCOPY WITH PROPOFOL ;  Surgeon: Claudette Cue, MD;  Location: [facility];  Service: Endoscopy;  Laterality: N/A;   COLONOSCOPY WITH PROPOFOL  N/A 11/04/2015   Procedure: COLONOSCOPY WITH PROPOFOL ;  Surgeon: Kenney Peacemaker, MD;  Location: [facility];  Service: Endoscopy;  Laterality: N/A;   ESOPHAGOGASTRODUODENOSCOPY  10/02/2020   hyperplastic gastric polyp   HOT HEMOSTASIS N/A 10/07/2014   Procedure: HOT HEMOSTASIS (ARGON PLASMA COAGULATION/BICAP);  Surgeon: Claudette Cue, MD;  [facility];  Service: Endoscopy;  Laterality: N/A;   HOT HEMOSTASIS N/A 02/11/2015   Procedure: HOT HEMOSTASIS (ARGON PLASMA COAGULATION/BICAP);  Surgeon: Kenney Peacemaker, MD;  Location: [facility];  Service: Endoscopy;  Laterality: N/A;   right foot  SHOULDER ARTHROSCOPY Left 01/21/2021   Procedure: LEFT SHOULDER ARTHROSCOPY AND DEBRIDEMENT;  Surgeon: Timothy Ford, MD;  Location: [facility] OR;  Service: Orthopedics;  Laterality: Left;   SHOULDER ARTHROSCOPY W/ ROTATOR CUFF REPAIR     both shoulders    Social History   Socioeconomic History   Marital status: Married    Spouse name: Not on file   Number of children: 2   Years of education: Not on file   Highest education level: Not on file  Occupational History   Not on file  Tobacco Use   Smoking status: Former    Current packs/day: 0.00    Types: Cigarettes    Quit date: 10/19/1980    Years since quitting: 42.7   Smokeless tobacco: Never  Vaping Use   Vaping status: Never  Used  Substance and Sexual Activity   Alcohol use: Yes    Alcohol/week: 1.0 standard drink of alcohol    Types: 1 Cans of beer per week    Comment: occasionally   Drug use: No   Sexual activity: Not on file  Other Topics Concern   Not on file  Social History Narrative   Not on file   Social Drivers of Health   Financial Resource Strain: Low Risk  (06/03/2023)   Received from [facility]   Overall Financial Resource Strain (CARDIA)    Difficulty of Paying Living Expenses: Not hard at all  Food Insecurity: No Food Insecurity (06/03/2023)   Received from [facility]   Hunger Vital Sign    Worried About Running Out of Food in the Last Year: Never true    Ran Out of Food in the Last Year: Never true  Transportation Needs: No Transportation Needs (06/03/2023)   Received from [facility] - Transportation    Lack of Transportation (Medical): No    Lack of Transportation (Non-Medical): No  Physical Activity: Unknown (06/03/2023)   Received from [facility]   Exercise Vital Sign    Days of Exercise per Week: Patient declined    Minutes of Exercise per Session: 0 min  Recent Concern: Physical Activity - Insufficiently Active (03/20/2023)   Received from [facility]   Exercise Vital Sign    Days of Exercise per Week: 1 day    Minutes of Exercise per Session: 20 min  Stress: No Stress Concern Present (06/03/2023)   Received from [facility] - Occupational Stress Questionnaire    Feeling of Stress : Not at all  Social Connections: Socially Integrated (06/03/2023)   Received from [facility]   Social Network    How would you rate your social network (family, work, friends)?: Good participation with social networks  Intimate Partner Violence: Not At Risk (06/03/2023)   Received from [facility]   HITS    Over the last 12 months how often did your partner physically hurt you?: Never    Over the last 12 months how often did  your partner insult you or talk down to you?: Never    Over the last 12 months how often did your partner threaten you with physical harm?: Never    Over the last 12 months how often did your partner scream or curse at you?: Never    Family History  Problem Relation Age of Onset   Colon cancer Brother    Colon polyps Brother    Esophageal cancer Neg Hx    Rectal cancer Neg Hx  Stomach cancer Neg Hx     ROS: no fevers or chills, productive cough, hemoptysis, dysphasia, odynophagia, melena, hematochezia, dysuria, hematuria, rash, seizure activity, orthopnea, PND, pedal edema, claudication. Remaining systems are negative.  Physical Exam: Well-developed well-nourished in no acute distress.  Skin is warm and dry.  HEENT is normal.  Neck is supple.  Chest is clear to auscultation with normal expansion.  Cardiovascular exam is regular rate and rhythm.  Abdominal exam nontender or distended. No masses palpated. Extremities show no edema. neuro grossly intact  EKG Interpretation Date/Time:  Monday July 25 2023 08:11:23 EDT Ventricular Rate:  62 PR Interval:  168 QRS Duration:  94 QT Interval:  402 QTC Calculation: 408 R Axis:   -33  Text Interpretation: Normal sinus rhythm Left axis deviation Low voltage QRS Cannot rule out Anterior infarct , age undetermined Confirmed by Alexandria Angel ([id number]) on 07/25/2023 8:13:08 AM    A/P  1 coronary artery disease-patient denies chest pain.  As outlined in HPI previous CTA showed moderate stenosis of the mid LAD which we are treating medically.  Continue aspirin  and statin.  2 hyperlipidemia-continue statin.  Most recent LDL January 2025 28.  Liver functions were normal.  3 hypertension-patient's blood pressure is controlled.  Continue present medical regimen.  Alexandria Angel, MD

## 2023-07-25 ENCOUNTER — Encounter: Payer: Self-pay | Admitting: Cardiology

## 2023-07-25 ENCOUNTER — Ambulatory Visit: Payer: Medicare HMO | Attending: Cardiology | Admitting: Cardiology

## 2023-07-25 VITALS — BP 136/62 | HR 62 | Ht 72.0 in | Wt 250.2 lb

## 2023-07-25 DIAGNOSIS — E7800 Pure hypercholesterolemia, unspecified: Secondary | ICD-10-CM

## 2023-07-25 DIAGNOSIS — I10 Essential (primary) hypertension: Secondary | ICD-10-CM | POA: Diagnosis not present

## 2023-07-25 DIAGNOSIS — I2510 Atherosclerotic heart disease of native coronary artery without angina pectoris: Secondary | ICD-10-CM

## 2023-07-25 NOTE — Patient Instructions (Signed)
 Medication Instructions:  Your physician recommends that you continue on your current medications as directed. Please refer to the Current Medication list given to you today.    *If you need a refill on your cardiac medications before your next appointment, please call your pharmacy*   Lab Work: None    If you have labs (blood work) drawn today and your tests are completely normal, you will receive your results only by: MyChart Message (if you have MyChart) OR A paper copy in the mail If you have any lab test that is abnormal or we need to change your treatment, we will call you to review the results.   Testing/Procedures: None    Follow-Up: At [facility], you and your health needs are our priority.  As part of our continuing mission to provide you with exceptional heart care, we have created designated Provider Care Teams.  These Care Teams include your primary Cardiologist (physician) and Advanced Practice Providers (APPs -  Physician Assistants and Nurse Practitioners) who all work together to provide you with the care you need, when you need it.  We recommend signing up for the patient portal called "MyChart".  Sign up information is provided on this After Visit Summary.  MyChart is used to connect with patients for Virtual Visits (Telemedicine).  Patients are able to view lab/test results, encounter notes, upcoming appointments, etc.  Non-urgent messages can be sent to your provider as well.   To learn more about what you can do with MyChart, go to [email or web address].    Your next appointment:   1 year(s)  The format for your next appointment:   In Person  Provider:   Alexandria Angel, MD   Other Instructions

## 2023-08-23 ENCOUNTER — Encounter: Payer: Self-pay | Admitting: Internal Medicine

## 2023-08-28 ENCOUNTER — Other Ambulatory Visit: Payer: Self-pay | Admitting: Cardiology

## 2023-09-20 LAB — LAB REPORT - SCANNED
A1c: 5.7
A1c: 5.7
EGFR: 93
EGFR: 93

## 2023-10-25 ENCOUNTER — Ambulatory Visit (AMBULATORY_SURGERY_CENTER)

## 2023-10-25 ENCOUNTER — Encounter: Payer: Self-pay | Admitting: Internal Medicine

## 2023-10-25 VITALS — Ht 72.0 in | Wt 245.0 lb

## 2023-10-25 DIAGNOSIS — Z860101 Personal history of adenomatous and serrated colon polyps: Secondary | ICD-10-CM

## 2023-10-25 DIAGNOSIS — Z800 Family history of malignant neoplasm of digestive organs: Secondary | ICD-10-CM

## 2023-10-25 MED ORDER — NA SULFATE-K SULFATE-MG SULF 17.5-3.13-1.6 GM/177ML PO SOLN: 1.0000 | Freq: Once | ORAL | 0 refills | Status: AC

## 2023-10-25 NOTE — Progress Notes (Signed)
 No egg or soy allergy known to patient  No issues known to pt with past sedation with any surgeries or procedures Patient denies ever being told they had issues or difficulty with intubation  No FH of Malignant Hyperthermia Pt is not on diet pills Pt is not on  home 02  Pt is not on blood thinners  Pt denies issues with constipation- stool softener daily  No A fib or A flutter Have any cardiac testing pending--NO Pt can ambulate- INDEPENDENTLY  Pt denies use of chewing tobacco Discussed diabetic I weight loss medication holds Discussed NSAID holds Checked BMI Pt instructed to use Singlecare.com or GoodRx for a price reduction on prep  Patient's chart reviewed by Norleen Schillings CNRA prior to previsit and patient appropriate for the [facility].  Pre visit completed and red dot placed by patient's name on their procedure day (on provider's schedule).

## 2023-10-30 NOTE — Progress Notes (Unsigned)
 [facility] History and Physical   Primary Care Physician:  Sophronia Ozell BROCKS, MD   Reason for Procedure:  History of colon polyps also has family history of colon cancer in brother  Plan:    Colonoscopy     HPI: Paul Hicks is a 73 y.o. male here for a surveillance colonoscopy.  Polyp history as below.  He does have severe diverticulosis and a pediatric colonoscope has been used in the past but not all the time. - Personal history of colonic polyps. And FHx CRCA                            brother                           07/2014 2 removed and large cecal found                           09/2014 and 01/2015 large cecal adenoma                            removed/ablated                           10/2015 - cecal polyp confirmed eradication,                            diminutive sigmoid adenoma removed   September 20 20-2 diminutive adenomas  Past Medical History:  Diagnosis Date   Allergy    Pollen - no meds    Anxiety    Arthritis    left shoulder, neck, back    Cataract    removed both eyes    Diabetes mellitus without complication (HCC)    GERD (gastroesophageal reflux disease)    History of adenomatous polyp of colon 07/30/2014   Hypertension    Polyp of colon     Past Surgical History:  Procedure Laterality Date   CATARACT EXTRACTION Bilateral    COLONOSCOPY     COLONOSCOPY N/A 02/11/2015   Procedure: COLONOSCOPY;  Surgeon: Lupita FORBES Commander, MD;  Location: [facility];  Service: Endoscopy;  Laterality: N/A;   COLONOSCOPY WITH PROPOFOL  N/A 10/07/2014   Procedure: COLONOSCOPY WITH PROPOFOL ;  Surgeon: Lamar JONETTA Aho, MD;  Location: [facility];  Service: Endoscopy;  Laterality: N/A;   COLONOSCOPY WITH PROPOFOL  N/A 11/04/2015   Procedure: COLONOSCOPY WITH PROPOFOL ;  Surgeon: Lupita FORBES Commander, MD;  Location: [facility];  Service: Endoscopy;  Laterality: N/A;   ESOPHAGOGASTRODUODENOSCOPY  10/02/2020   hyperplastic gastric polyp   HOT HEMOSTASIS N/A  10/07/2014   Procedure: HOT HEMOSTASIS (ARGON PLASMA COAGULATION/BICAP);  Surgeon: Lamar JONETTA Aho, MD;  Location: [facility];  Service: Endoscopy;  Laterality: N/A;   HOT HEMOSTASIS N/A 02/11/2015   Procedure: HOT HEMOSTASIS (ARGON PLASMA COAGULATION/BICAP);  Surgeon: Lupita FORBES Commander, MD;  Location: [facility];  Service: Endoscopy;  Laterality: N/A;   right foot     SHOULDER ARTHROSCOPY Left 01/21/2021   Procedure: LEFT SHOULDER ARTHROSCOPY AND DEBRIDEMENT;  Surgeon: Harden Jerona GAILS, MD;  Location: [facility] OR;  Service: Orthopedics;  Laterality: Left;   SHOULDER ARTHROSCOPY W/ ROTATOR CUFF REPAIR     both shoulders     Current Outpatient Medications  Medication Sig Dispense Refill  aspirin  EC 81 MG tablet Take 1 tablet (81 mg total) by mouth daily. Swallow whole. 90 tablet 3   Blood Glucose Monitoring Suppl (ONE TOUCH ULTRA 2) w/Device KIT USE AS DIRECTED     Cholecalciferol (D3 ADULT PO) Take by mouth.     cloNIDine  (CATAPRES ) 0.2 MG tablet TAKE 1 TABLET BY MOUTH TWICE A DAY 180 tablet 3   Cyanocobalamin (B-12) 1000 MCG CAPS Take by mouth.     diltiazem (CARDIZEM CD) 300 MG 24 hr capsule Take 300 mg by mouth daily.     Docusate Sodium (DSS) 100 MG CAPS Take 100 mg by mouth.     ferrous sulfate  325 (65 FE) MG EC tablet Take 1 tablet (325 mg total) by mouth daily with breakfast.  3   FLUoxetine (PROZAC) 20 MG capsule Take 20 mg by mouth daily.     glucose blood test strip USE AS DIRECTED ONCE DAILY     omeprazole (PRILOSEC) 40 MG capsule Take 40 mg by mouth daily.     OneTouch Delica Lancets 33G MISC TEST DAILY     rosuvastatin  (CRESTOR ) 40 MG tablet Take 1 tablet (40 mg total) by mouth daily. Pt needs to call office for an appointment before he can get additional refills x1 90 tablet 2   traMADol (ULTRAM) 50 MG tablet Take 50 mg by mouth every 8 (eight) hours as needed.     valsartan-hydrochlorothiazide (DIOVAN-HCT) 320-25 MG tablet Take 1 tablet by mouth daily.     FARXIGA 10 MG TABS  tablet Take 10 mg by mouth daily. (Patient not taking: Reported on 10/25/2023)     MOUNJARO 7.5 MG/0.5ML Pen Inject 7.5 mg into the skin once a week.     Current Facility-Administered Medications  Medication Dose Route Frequency Provider Last Rate Last Admin   0.9 %  sodium chloride  infusion  500 mL Intravenous Once Avram Lupita BRAVO, MD        Allergies as of 10/31/2023   (No Known Allergies)    Family History  Problem Relation Age of Onset   Rectal cancer Brother    Colon cancer Brother    Colon polyps Brother    Stomach cancer Paternal Grandfather    Esophageal cancer Neg Hx     Social History   Socioeconomic History   Marital status: Married    Spouse name: Not on file   Number of children: 2   Years of education: Not on file   Highest education level: Not on file  Occupational History   Not on file  Tobacco Use   Smoking status: Former    Current packs/day: 0.00    Types: Cigarettes    Quit date: 10/19/1980    Years since quitting: 43.0   Smokeless tobacco: Never  Vaping Use   Vaping status: Never Used  Substance and Sexual Activity   Alcohol use: Yes    Alcohol/week: 1.0 standard drink of alcohol    Types: 1 Cans of beer per week    Comment: occasionally   Drug use: No   Sexual activity: Not on file  Other Topics Concern   Not on file  Social History Narrative   Not on file   Social Drivers of Health   Financial Resource Strain: Low Risk  (06/03/2023)   Received from [facility]   Overall Financial Resource Strain (CARDIA)    Difficulty of Paying Living Expenses: Not hard at all  Food Insecurity: No Food Insecurity (06/03/2023)   Received from [facility]   Hunger Vital Sign    Within the past 12 months, you worried that your food would run out before you got the money to buy more.: Never true    Within the past 12 months, the food you bought just didn't last and you didn't have money to get more.: Never true  Transportation Needs: No Transportation  Needs (06/03/2023)   Received from [facility]   PRAPARE - Transportation    Lack of Transportation (Medical): No    Lack of Transportation (Non-Medical): No  Physical Activity: Unknown (06/03/2023)   Received from [facility]   Exercise Vital Sign    On average, how many days per week do you engage in moderate to strenuous exercise (like a brisk walk)?: Patient declined    On average, how many minutes do you engage in exercise at this level?: 0 min  Recent Concern: Physical Activity - Insufficiently Active (03/20/2023)   Received from [facility]   Exercise Vital Sign    Days of Exercise per Week: 1 day    Minutes of Exercise per Session: 20 min  Stress: No Stress Concern Present (06/03/2023)   Received from [facility] - Occupational Stress Questionnaire    Feeling of Stress : Not at all  Social Connections: Socially Integrated (06/03/2023)   Received from [facility]   Social Network    How would you rate your social network (family, work, friends)?: Good participation with social networks  Intimate Partner Violence: Not At Risk (06/03/2023)   Received from [facility]   HITS    Over the last 12 months how often did your partner physically hurt you?: Never    Over the last 12 months how often did your partner insult you or talk down to you?: Never    Over the last 12 months how often did your partner threaten you with physical harm?: Never    Over the last 12 months how often did your partner scream or curse at you?: Never    Review of Systems:  All other review of systems negative except as mentioned in the HPI.  Physical Exam: Vital signs BP (!) 153/88   Pulse 74   Temp 98 F (36.7 C)   Ht 6' (1.829 m)   Wt 245 lb (111.1 kg)   SpO2 94%   BMI 33.23 kg/m   General:   Alert,  Well-developed, well-nourished, pleasant and cooperative in NAD Lungs:  Clear throughout to auscultation.   Heart:  Regular rate and rhythm; no  murmurs, clicks, rubs,  or gallops. Abdomen:  Soft, nontender and nondistended. Normal bowel sounds.   Neuro/Psych:  Alert and cooperative. Normal mood and affect. A and O x 3   @Zackerie Sara  CHARLENA Commander, MD, [facility] [phone] (pager) 10/31/2023 8:01 AM@

## 2023-10-31 ENCOUNTER — Encounter: Payer: Self-pay | Admitting: Internal Medicine

## 2023-10-31 ENCOUNTER — Ambulatory Visit: Admitting: Internal Medicine

## 2023-10-31 VITALS — BP 150/74 | HR 61 | Temp 98.0°F | Resp 19 | Ht 72.0 in | Wt 245.0 lb

## 2023-10-31 DIAGNOSIS — Z1211 Encounter for screening for malignant neoplasm of colon: Secondary | ICD-10-CM

## 2023-10-31 DIAGNOSIS — Z800 Family history of malignant neoplasm of digestive organs: Secondary | ICD-10-CM

## 2023-10-31 DIAGNOSIS — Z860101 Personal history of adenomatous and serrated colon polyps: Secondary | ICD-10-CM | POA: Diagnosis not present

## 2023-10-31 DIAGNOSIS — K648 Other hemorrhoids: Secondary | ICD-10-CM | POA: Diagnosis not present

## 2023-10-31 DIAGNOSIS — K5730 Diverticulosis of large intestine without perforation or abscess without bleeding: Secondary | ICD-10-CM | POA: Diagnosis not present

## 2023-10-31 MED ORDER — SODIUM CHLORIDE 0.9 % IV SOLN: 500.0000 mL | Freq: Once | INTRAVENOUS | Status: DC

## 2023-10-31 NOTE — Progress Notes (Signed)
 Pt's states no medical or surgical changes since previsit or office visit.

## 2023-10-31 NOTE — Op Note (Signed)
 [facility] Patient Name: Paul Hicks Procedure Date: 10/31/2023 7:27 AM MRN: [id number] Endoscopist: Lupita FORBES Commander , MD, [id number] Age: 73 Referring MD:  Date of Birth: 08/06/1950 Gender: Male Account #: [id number] Procedure:                Colonoscopy Indications:              Surveillance: Personal history of adenomatous                            polyps on last colonoscopy 5 years ago, Last                            colonoscopy: September 2020 Medicines:                Monitored Anesthesia Care Procedure:                Pre-Anesthesia Assessment:                           - Prior to the procedure, a History and Physical                            was performed, and patient medications and                            allergies were reviewed. The patient's tolerance of                            previous anesthesia was also reviewed. The risks                            and benefits of the procedure and the sedation                            options and risks were discussed with the patient.                            All questions were answered, and informed consent                            was obtained. Prior Anticoagulants: The patient has                            taken no anticoagulant or antiplatelet agents. ASA                            Grade Assessment: II - A patient with mild systemic                            disease. After reviewing the risks and benefits,                            the patient was deemed in satisfactory condition to  undergo the procedure.                           After obtaining informed consent, the colonoscope                            was passed under direct vision. Throughout the                            procedure, the patient's blood pressure, pulse, and                            oxygen saturations were monitored continuously. The                            Olympus Scope SN: [id number] was introduced  through                            the anus and advanced to the the cecum, identified                            by appendiceal orifice and ileocecal valve. The                            colonoscopy was performed without difficulty. The                            patient tolerated the procedure well. The quality                            of the bowel preparation was adequate. The                            ileocecal valve, appendiceal orifice, and rectum                            were photographed. The bowel preparation used was                            SUPREP via split dose instruction. Scope In: 8:09:20 AM Scope Out: 8:24:28 AM Scope Withdrawal Time: 0 hours 12 minutes 53 seconds  Total Procedure Duration: 0 hours 15 minutes 8 seconds  Findings:                 The perianal and digital rectal examinations were                            normal. Pertinent negatives include normal prostate                            (size, shape, and consistency).                           Multiple diverticula were found in the sigmoid  colon. There was narrowing of the colon in                            association with the diverticular opening.                           Internal hemorrhoids were found. The hemorrhoids                            were small.                           The exam was otherwise without abnormality on                            direct and retroflexion views. Complications:            No immediate complications. Estimated Blood Loss:     Estimated blood loss: none. Impression:               - Severe diverticulosis in the sigmoid colon. There                            was narrowing of the colon in association with the                            diverticular opening.                           - Internal hemorrhoids.                           - The examination was otherwise normal on direct                            and retroflexion views.                            - No specimens collected.                           -- Personal history of colonic polyps. And FHx CRCA                           brother < 60                           07/2014 2 removed and large cecal found                           09/2014 and 01/2015 large cecal adenoma                           removed/ablated                           10/2015 - cecal polyp confirmed eradication,  diminutive sigmoid adenoma removed                           September 2020-2 diminutive adenomas Recommendation:           - Patient has a contact number available for                            emergencies. The signs and symptoms of potential                            delayed complications were discussed with the                            patient. Return to normal activities tomorrow.                            Written discharge instructions were provided to the                            patient.                           - Resume previous diet.                           - Continue present medications.                           - Repeat colonoscopy in 5 years for surveillance if                            health status permits (given his polyp history and                            family history would consider). Lupita FORBES Commander, MD 10/31/2023 8:38:26 AM This report has been signed electronically.

## 2023-10-31 NOTE — Patient Instructions (Addendum)
 No polyps or cancer seen today.  You still have diverticulosis - thickened muscle rings and pouches in the colon wall. Please read the handout about this condition.  Hemorrhoids seen also.  Would consider a repeat colonoscopy in 5 years depending upon health status.  I appreciate the opportunity to care for you. Lupita CHARLENA Commander, MD, FACG  YOU HAD AN ENDOSCOPIC PROCEDURE TODAY AT THE [facility]:   Refer to the procedure report that was given to you for any specific questions about what was found during the examination.  If the procedure report does not answer your questions, please call your gastroenterologist to clarify.  If you requested that your care partner not be given the details of your procedure findings, then the procedure report has been included in a sealed envelope for you to review at your convenience later.  YOU SHOULD EXPECT: Some feelings of bloating in the abdomen. Passage of more gas than usual.  Walking can help get rid of the air that was put into your GI tract during the procedure and reduce the bloating. If you had a lower endoscopy (such as a colonoscopy or flexible sigmoidoscopy) you may notice spotting of blood in your stool or on the toilet paper. If you underwent a bowel prep for your procedure, you may not have a normal bowel movement for a few days.  Please Note:  You might notice some irritation and congestion in your nose or some drainage.  This is from the oxygen used during your procedure.  There is no need for concern and it should clear up in a day or so.  SYMPTOMS TO REPORT IMMEDIATELY:  Following lower endoscopy (colonoscopy or flexible sigmoidoscopy):  Excessive amounts of blood in the stool  Significant tenderness or worsening of abdominal pains  Swelling of the abdomen that is new, acute  Fever of 100F or higher  For urgent or emergent issues, a gastroenterologist can be reached at any hour by calling [phone]. Do not use MyChart  messaging for urgent concerns.    DIET:  We do recommend a small meal at first, but then you may proceed to your regular diet.  Drink plenty of fluids but you should avoid alcoholic beverages for 24 hours.  ACTIVITY:  You should plan to take it easy for the rest of today and you should NOT DRIVE or use heavy machinery until tomorrow (because of the sedation medicines used during the test).    FOLLOW UP: Our staff will call the number listed on your records the next business day following your procedure.  We will call around 7:15- 8:00 am to check on you and address any questions or concerns that you may have regarding the information given to you following your procedure. If we do not reach you, we will leave a message.     If any biopsies were taken you will be contacted by phone or by letter within the next 1-3 weeks.  Please call us  at [phone] if you have not heard about the biopsies in 3 weeks.    SIGNATURES/CONFIDENTIALITY: You and/or your care partner have signed paperwork which will be entered into your electronic medical record.  These signatures attest to the fact that that the information above on your After Visit Summary has been reviewed and is understood.  Full responsibility of the confidentiality of this discharge information lies with you and/or your care-partner.

## 2023-11-01 ENCOUNTER — Telehealth: Payer: Self-pay | Admitting: *Deleted

## 2023-11-01 NOTE — Telephone Encounter (Signed)
 No answer on follow up call. Left message.

## 2023-11-04 ENCOUNTER — Encounter: Payer: Self-pay | Admitting: *Deleted
